# Patient Record
Sex: Male | Born: 1990 | Race: White | Hispanic: No | Marital: Single | Smoking: Never smoker
Health system: Southern US, Community
[De-identification: ages and names within clinical notes are randomized; demographics above are authoritative.]

## PROBLEM LIST (undated history)

## (undated) DIAGNOSIS — F840 Autistic disorder: Secondary | ICD-10-CM

## (undated) DIAGNOSIS — R569 Unspecified convulsions: Secondary | ICD-10-CM

## (undated) DIAGNOSIS — F419 Anxiety disorder, unspecified: Secondary | ICD-10-CM

## (undated) HISTORY — PX: CIRCUMCISION: SUR203

---
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
## 2011-10-20 ENCOUNTER — Emergency Department (HOSPITAL_COMMUNITY): Payer: Medicaid Other

## 2011-10-20 ENCOUNTER — Emergency Department (HOSPITAL_COMMUNITY)
Admission: EM | Admit: 2011-10-20 | Discharge: 2011-10-21 | Disposition: A | Discharge status: Home / Self Care | Payer: Medicaid Other | Attending: Emergency Medicine | Admitting: Emergency Medicine

## 2011-10-20 ENCOUNTER — Emergency Department (HOSPITAL_COMMUNITY)
Admission: EM | Admit: 2011-10-20 | Discharge: 2011-10-20 | Disposition: A | Discharge status: Home / Self Care | Payer: Medicaid Other | Attending: Emergency Medicine | Admitting: Emergency Medicine

## 2011-10-20 ENCOUNTER — Inpatient Hospital Stay (HOSPITAL_COMMUNITY)
Admission: RE | Admit: 2011-10-20 | Discharge: 2011-10-20 | Disposition: A | Discharge status: Home / Self Care | Payer: Medicaid Other | Source: Ambulatory Visit | Attending: Emergency Medicine | Admitting: Emergency Medicine

## 2011-10-20 ENCOUNTER — Encounter (HOSPITAL_COMMUNITY): Payer: Self-pay | Admitting: *Deleted

## 2011-10-20 DIAGNOSIS — F840 Autistic disorder: Secondary | ICD-10-CM | POA: Insufficient documentation

## 2011-10-20 DIAGNOSIS — R569 Unspecified convulsions: Secondary | ICD-10-CM

## 2011-10-20 DIAGNOSIS — F411 Generalized anxiety disorder: Secondary | ICD-10-CM | POA: Insufficient documentation

## 2011-10-20 DIAGNOSIS — Z79899 Other long term (current) drug therapy: Secondary | ICD-10-CM | POA: Insufficient documentation

## 2011-10-20 DIAGNOSIS — R404 Transient alteration of awareness: Secondary | ICD-10-CM | POA: Insufficient documentation

## 2011-10-20 DIAGNOSIS — G40909 Epilepsy, unspecified, not intractable, without status epilepticus: Secondary | ICD-10-CM | POA: Insufficient documentation

## 2011-10-20 HISTORY — DX: Autistic disorder: F84.0

## 2011-10-20 HISTORY — DX: Unspecified convulsions: R56.9

## 2011-10-20 HISTORY — DX: Anxiety disorder, unspecified: F41.9

## 2011-10-20 LAB — POCT I-STAT, CHEM 8
Chloride: 108 mEq/L (ref 96–112)
Creatinine, Ser: 1 mg/dL (ref 0.50–1.35)
Glucose, Bld: 99 mg/dL (ref 70–99)
Potassium: 3.7 mEq/L (ref 3.5–5.1)
Sodium: 142 mEq/L (ref 135–145)

## 2011-10-20 MED ORDER — SODIUM CHLORIDE 0.9 % IV SOLN
1000.0000 mg | INTRAVENOUS | Status: AC
  Administered 2011-10-20: 1000 mg via INTRAVENOUS
  Filled 2011-10-20: qty 10

## 2011-10-20 MED ORDER — LEVETIRACETAM 100 MG/ML PO SOLN: 500.0000 mg | Freq: Two times a day (BID) | ORAL | Status: DC

## 2011-10-20 NOTE — Discharge Instructions (Signed)
Epilepsy  A seizure (convulsion) is a sudden change in brain function that causes a change in behavior, muscle activity, or ability to remain awake and alert. If a person has recurring seizures, this is called epilepsy.  CAUSES   Epilepsy is a disorder with many possible causes. Anything that disturbs the normal pattern of brain cell activity can lead to seizures. Seizure can be caused from illness to brain damage to abnormal brain development. Epilepsy may develop because of:   An abnormality in brain wiring.   An imbalance of nerve signaling chemicals (neurotransmitters).   Some combination of these factors.  Scientists are learning an increasing amount about genetic causes of seizures.  SYMPTOMS   The symptoms of a seizure can vary greatly from one person to another. These may include:   An aura, or warning that tells a person they are about to have a seizure.   Abnormal sensations, such as abnormal smell or seeing flashing lights.   Sudden, general body stiffness.   Rhythmic jerking of the face, arm, or leg - on one or both sides.   Sudden change in consciousness.   The person may appear to be awake but not responding.   They may appear to be asleep but cannot be awakened.   Grimacing, chewing, lip smacking, or drooling.   Often there is a period of sleepiness after a seizure.  DIAGNOSIS   The description you give to your caregiver about what you experienced will help them understand your problems. Equally important is the description by any witnesses to your seizure. A physical exam, including a detailed neurological exam, is necessary. An EEG (electroencephalogram) is a painless test of your brain waves. In this test a diagram is created of your brain waves. These diagrams can be interpreted by a specialist. Pictures of your brain are usually taken with:   An MRI.   A CT scan.  Lab tests may be done to look for:   Signs of infection.   Abnormal blood chemistry.  PREVENTION   There is no way to  prevent the development of epilepsy. If you have seizures that are typically triggered by an event (such as flashing lights), try to avoid the trigger. This can help you avoid a seizure.   PROGNOSIS   Most people with epilepsy lead outwardly normal lives. While epilepsy cannot currently be cured, for some people it does eventually go away. Most seizures do not cause brain damage. It is not uncommon for people with epilepsy, especially children, to develop behavioral and emotional problems. These problems are sometimes the consequence of medicine for seizures or social stress. For some people with epilepsy, the risk of seizures restricts their independence and recreational activities. For example, some states refuse drivers licenses to people with epilepsy.  Most women with epilepsy can become pregnant. They should discuss their epilepsy and the medicine they are taking with their caregivers. Women with epilepsy have a 90 percent or better chance of having a normal, healthy baby.  RISKS AND COMPLICATIONS   People with epilepsy are at increased risk of falls, accidents, and injuries. People with epilepsy are at special risk for two life-threatening conditions. These are status epilepticus and sudden unexplained death (extremely rare). Status epilepticus is a long lasting, continuous seizure that is a medical emergency.  TREATMENT   Once epilepsy is diagnosed, it is important to begin treatment as soon as possible. For about 80 percent of those diagnosed with epilepsy, seizures can be controlled with modern medicines   and surgical techniques. Some antiepileptic drugs can interfere with the effectiveness of oral contraceptives. In 1997, the FDA approved a pacemaker for the brain the (vagus nerve stimulator). This stimulator can be used for people with seizures that are not well-controlled by medicine. Studies have shown that in some cases, children may experience fewer seizures if they maintain a strict diet. The strict  diet is called the ketogenic diet. This diet is rich in fats and low in carbohydrates.  HOME CARE INSTRUCTIONS    Your caregiver will make recommendations about driving and safety in normal activities. Follow these carefully.   Take any medicine prescribed exactly as directed.   Do any blood tests requested to monitor the levels of your medicine.   The people you live and work with should know that you are prone to seizures. They should receive instructions on how to help you. In general, a witness to a seizure should:   Cushion your head and body.   Turn you on your side.   Avoid unnecessarily restraining you.   Not place anything inside your mouth.   Call for local emergency medical help if there is any question about what has occurred.   Keep a seizure diary. Record what you recall about any seizure, especially any possible trigger.   If your caregiver has given you a follow-up appointment, it is very important to keep that appointment. Not keeping the appointment could result in permanent injury and disability. If there is any problem keeping the appointment, you must call back to this facility for assistance.  SEEK MEDICAL CARE IF:    You develop signs of infection or other illness. This might increase the risk of a seizure.   You seem to be having more frequent seizures.   Your seizure pattern is changing.  SEEK IMMEDIATE MEDICAL CARE IF:    A seizure does not stop after a few moments.   A seizure causes any difficulty in breathing.   A seizure results in a very severe headache.   A seizure leaves you with the inability to speak or use a part of your body.  MAKE SURE YOU:    Understand these instructions.   Will watch your condition.   Will get help right away if you are not doing well or get worse.  Document Released: 06/30/2005 Document Revised: 06/19/2011 Document Reviewed: 02/04/2008  [employer] Patient Information 2012 [employer].

## 2011-10-20 NOTE — ED Notes (Signed)
Patient transported to CT with this RN and mother

## 2011-10-20 NOTE — ED Provider Notes (Signed)
History     CSN: [id number]  Arrival date & time 10/20/11  1914   First MD Initiated Contact with Patient 10/20/11 2114      Chief Complaint  Patient presents with  . Seizures    (Consider location/radiation/quality/duration/timing/severity/associated sxs/prior treatment) Patient is a 21 y.o. male presenting with seizures. The history is provided by a parent.  Seizures  This is a recurrent problem. The current episode started 1 to 2 hours ago. The problem has been resolved. There was 1 seizure. The most recent episode lasted 2 to 5 minutes. Pertinent negatives include no confusion, no headaches, no neck stiffness, no chest pain, no cough, no nausea, no vomiting and no diarrhea. Characteristics include eye blinking, rhythmic jerking, loss of consciousness and bit tongue. The episode was witnessed. The seizures did not continue in the ED. The seizure(s) had no focality. Possible causes do not include med or dosage change or recent illness. There has been no fever. There were no medications administered prior to arrival.    Past Medical History  Diagnosis Date  . Autism   . Anxiety   . Seizures     History reviewed. No pertinent past surgical history.  No family history on file.  History  Substance Use Topics  . Smoking status: Never Smoker   . Smokeless tobacco: Not on file  . Alcohol Use: No      Review of Systems  Constitutional: Negative for fever and chills.  HENT: Negative for congestion and rhinorrhea.   Respiratory: Negative for cough and shortness of breath.   Cardiovascular: Negative for chest pain and leg swelling.  Gastrointestinal: Negative for nausea, vomiting, abdominal pain, diarrhea, constipation and blood in stool.  Genitourinary: Negative for dysuria and decreased urine volume.  Neurological: Positive for seizures and loss of consciousness. Negative for numbness and headaches.  Psychiatric/Behavioral: Negative for confusion.  All other systems reviewed  and are negative.    Allergies  Penicillins  Home Medications   Current Outpatient Rx  Name Route Sig Dispense Refill  . CLONAZEPAM 0.5 MG PO TBDP Oral Take 0.5 mg by mouth daily as needed. For travel anxiety      BP 129/76  Pulse 115  Temp(Src) 98.3 F (36.8 C) (Oral)  Resp 16  SpO2 98%  Physical Exam  Nursing note and vitals reviewed. Constitutional: He appears well-developed and well-nourished.  HENT:  Head: Normocephalic and atraumatic.  Right Ear: External ear normal.  Left Ear: External ear normal.  Nose: Nose normal.  Eyes: Pupils are equal, round, and reactive to light.  Neck: Neck supple.  Cardiovascular: Normal rate, regular rhythm, normal heart sounds and intact distal pulses.   Pulmonary/Chest: Effort normal.  Abdominal: Soft. He exhibits no distension and no mass. There is no tenderness. There is no rebound and no guarding.  Musculoskeletal: He exhibits no edema.  Lymphadenopathy:    He has no cervical adenopathy.  Neurological: He is alert. He is disoriented. GCS eye subscore is 4. GCS verbal subscore is 3. GCS motor subscore is 6.  Reflex Scores:      Patellar reflexes are 2+ on the right side and 2+ on the left side.      Achilles reflexes are 2+ on the right side and 2+ on the left side. Skin: Skin is warm and dry.    ED Course  Procedures (including critical care time)  Ct Head Wo Contrast  10/20/2011  *RADIOLOGY REPORT*  Clinical Data: Seizure.  Nonverbal autistic patient.  CT HEAD WITHOUT  CONTRAST  Technique:  Contiguous axial images were obtained from the base of the skull through the vertex without contrast.  Comparison: 09/30/2005  Findings: Bone windows demonstrate hypoplastic frontal sinuses. Clear mastoid air cells.  Soft tissue windows demonstrate no  mass lesion, hemorrhage, hydrocephalus, acute infarct, intra-axial, or extra-axial fluid collection.  IMPRESSION: Normal head CT.  Original Report Authenticated By: Consuello Bossier, M.D.      1. Seizure       MDM  21 yo male with no prior sz hx presents with a 2 min seizure about 1 hour ago. Also had one this morning where he was totally stiff. Had CT head and electrolytes checked, both of which were negative. Had postictal period afterwards, which has now resolved per family. Patient is at his baseline though is a little sleepier than normal. Since he's had 2 seizures today (both new), called neurology. Will load with keppra and place on keppra BID. Will try to follow up with neurologist as already discussed this AM (Dr. Sharene Skeans).         Pricilla Loveless, MD 10/20/11 [phone]

## 2011-10-20 NOTE — Procedures (Signed)
EEG NUMBER:  [id number].  CLINICAL HISTORY:  The patient is a 21 year old male with autism who was found stiff, unresponsive with eyes glazed over.  He had a possible febrile seizure at age 44.  Study is being done to evaluate a single seizure, definitely epilepsy (780.39).  PROCEDURE:  The tracing was carried out on a 32 channel digital Cadwell recorder, reformatted into 16 channel montages with one devoted to EKG. The patient was awake during the recording.  The international 10/20 system lead placement was used.  MEDICATIONS:  Include clonazepam.  RECORDING TIME:  Twenty-four and half minutes.  DESCRIPTION OF FINDINGS:  Dominant frequency is an 8-9 Hz, 10 microvolt alpha range activity that is broadly distributed.  Occasional upper theta and frontally predominant beta range activity was seen.  The patient remained awake throughout the record.  Activating procedures with photic stimulation failed to induce a driving response.  Hyperventilation was not carried out.  EKG showed regular sinus rhythm with ventricular response of 102 beats per minute.  IMPRESSION:  Essentially normal record with the patient awake.     Deanna Artis. Sharene Skeans, M.D.    ZOX:WRUE D:  10/20/2011 19:03:25  T:  10/20/2011 21:48:18  Job #:  [id number]

## 2011-10-20 NOTE — ED Notes (Signed)
Pt returned to room with this RN and mother

## 2011-10-20 NOTE — ED Provider Notes (Signed)
History     CSN: [id number]  Arrival date & time 10/20/11  1308   First MD Initiated Contact with Patient 10/20/11 [phone]      Chief Complaint  Patient presents with  . Seizures    (Consider location/radiation/quality/duration/timing/severity/associated sxs/prior treatment) HPI Patient is a 21 yo M with severe autism who presents today for evaluation of new-onset seizures.  Patient has no prior history of this but has a brother with severe autism.  Patient's mother reports that she heard noises from his room and came into find the patient shaking and unresponsive.  She thinks that this lasted a few minutes.  There was no reported cyanosis or incontinence.  PAtient returned to his baseline within 30 minutes.  He has been sleeping well and has had no recent signs of illness, fevers, or medication changes.  There is no history of trauma. There are no other associated or modifying factors.  Past Medical History  Diagnosis Date  . Autism   . Anxiety     History reviewed. No pertinent past surgical history.  History reviewed. No pertinent family history.  History  Substance Use Topics  . Smoking status: Never Smoker   . Smokeless tobacco: Not on file  . Alcohol Use: No      Review of Systems  Unable to perform ROS: Other    Allergies  Penicillins  Home Medications   Current Outpatient Rx  Name Route Sig Dispense Refill  . CLONAZEPAM 0.5 MG PO TABS Oral Take 0.5 mg by mouth See admin instructions. Patient only uses for anxiety when travelling      BP 133/82  Pulse 127  Temp(Src) 98.6 F (37 C) (Oral)  Resp 18  SpO2 99%  Physical Exam  Nursing note and vitals reviewed. Constitutional: He appears well-developed and well-nourished. No distress.  HENT:  Head: Normocephalic and atraumatic.  Eyes: Conjunctivae and EOM are normal. Pupils are equal, round, and reactive to light.  Neck: Normal range of motion.  Cardiovascular: Normal rate, regular rhythm, normal heart  sounds and intact distal pulses.   Pulmonary/Chest: Effort normal and breath sounds normal. No respiratory distress.  Abdominal: Soft. Bowel sounds are normal. He exhibits no distension. There is no tenderness. There is no rebound and no guarding.  Musculoskeletal: Normal range of motion. He exhibits no edema and no tenderness.  Neurological: He is alert.       Patient has severe autism and though he can say words he is minimally interactive.  He does not consistently follow commands which is baseline.  He is able to move all 4 extremities and has no obvious cranial nerve deficits.  Patient is able to ambulate without difficulty and mom confirms he is at his baseline.  Skin: Skin is warm and dry. No rash noted.    ED Course  Procedures (including critical care time)   Labs Reviewed  POCT I-STAT, CHEM 8   Ct Head Wo Contrast  10/20/2011  *RADIOLOGY REPORT*  Clinical Data: Seizure.  Nonverbal autistic patient.  CT HEAD WITHOUT CONTRAST  Technique:  Contiguous axial images were obtained from the base of the skull through the vertex without contrast.  Comparison: 09/30/2005  Findings: Bone windows demonstrate hypoplastic frontal sinuses. Clear mastoid air cells.  Soft tissue windows demonstrate no  mass lesion, hemorrhage, hydrocephalus, acute infarct, intra-axial, or extra-axial fluid collection.  IMPRESSION: Normal head CT.  Original Report Authenticated By: Consuello Bossier, M.D.     1. Seizure  MDM  Patient was evaluated by myself for new onset seizure.  Vials, chemistry , and CT head were all unremarkable.  I discussed the patient with Dr. Sharene Skeans given his autism.  Dr. Sharene Skeans requested ECG while in the ED as patient was being cooperative today.  This was arranged and performed prior to ED discharge.  Patient remained asymptomatic and at his neurologic baseline while in the ED.  Dr. Sharene Skeans did not recommend any meds at this time and patient will follow-up in  clinic.        Cyndra Numbers, MD 10/21/11 2052

## 2011-10-20 NOTE — ED Notes (Addendum)
No changes.  OK for pt to drink PO fluids (per EDP), pt/mother requesting water for pt.
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
## 2011-10-20 NOTE — ED Notes (Signed)
Mother reports pt had seizure this am, pt with no hx of seizures, pt is autistic. Reports pt has very stiff and "out of it" unable to state how long seizure lasted. Was not incontinent of urine.

## 2011-10-21 NOTE — ED Provider Notes (Signed)
I  reviewed the resident's note and I agree with the findings and plan.  Cheri Guppy, MD 10/21/11 [phone]

## 2013-02-18 IMAGING — CT CT HEAD W/O CM
1 of 2 series · 13 of 30 positions shown, 17 images · non-contrast
Comparison: 09/30/2005

CLINICAL DATA: Seizure.  Nonverbal autistic patient.

CT HEAD WITHOUT CONTRAST
TECHNIQUE: Contiguous axial images were obtained from the base of
the skull through the vertex without contrast.

[Series 2: brain · axial · 0.45mm/px · z∈[+130,+251]mm · 13 of 28 slices shown, 17 images]
[im 2/28  brain]
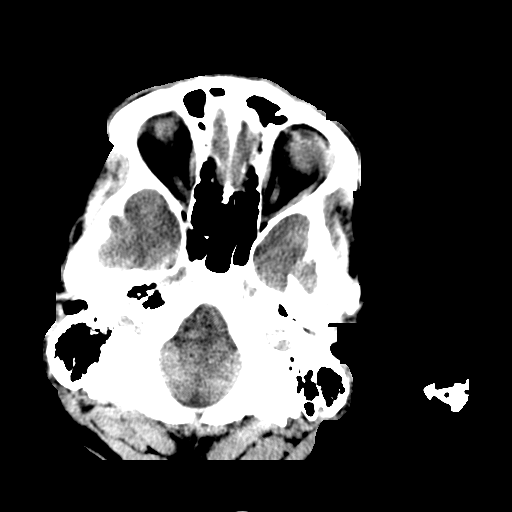
[im 2/28  bone]
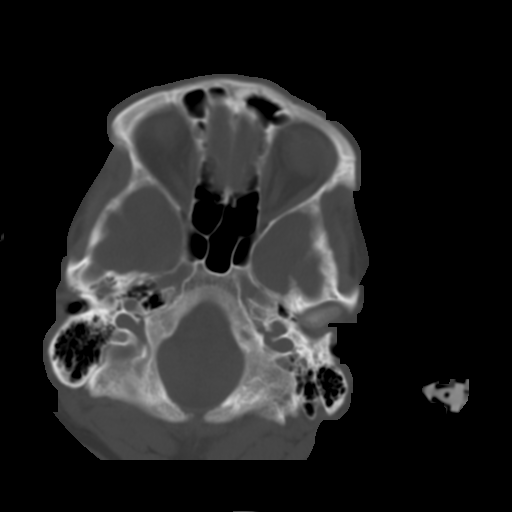
[im 4/28  brain]
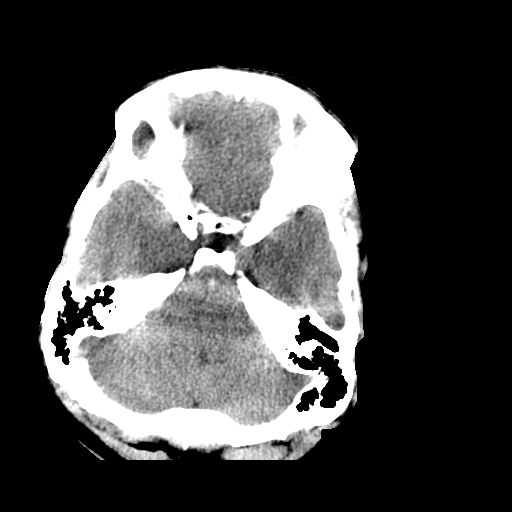
[im 6/28  brain]
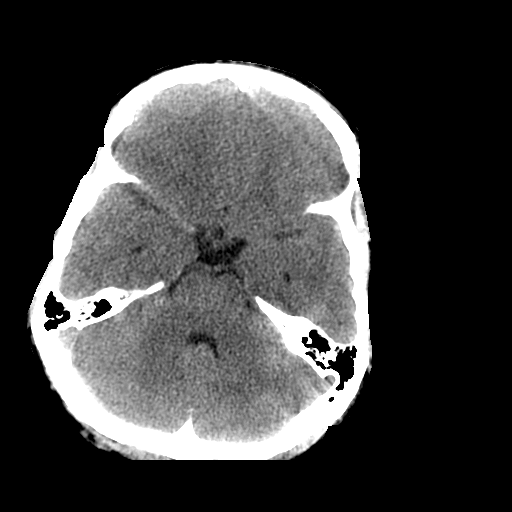
[im 8/28  brain]
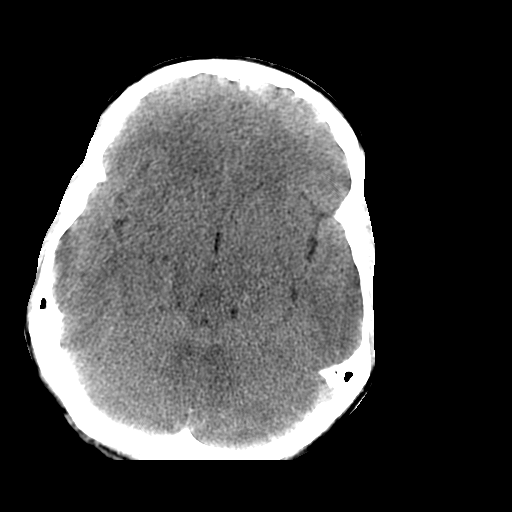
[im 10/28  brain]
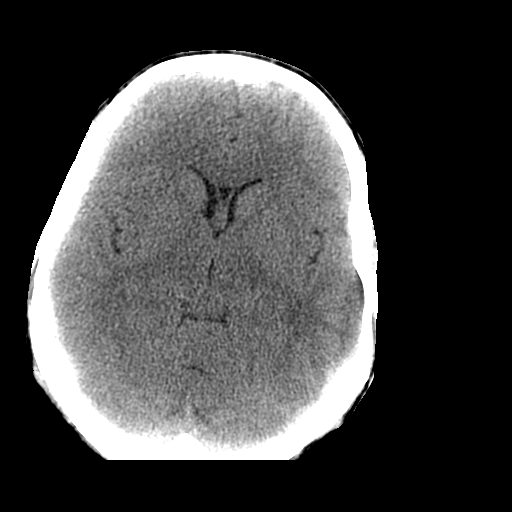
[im 10/28  bone]
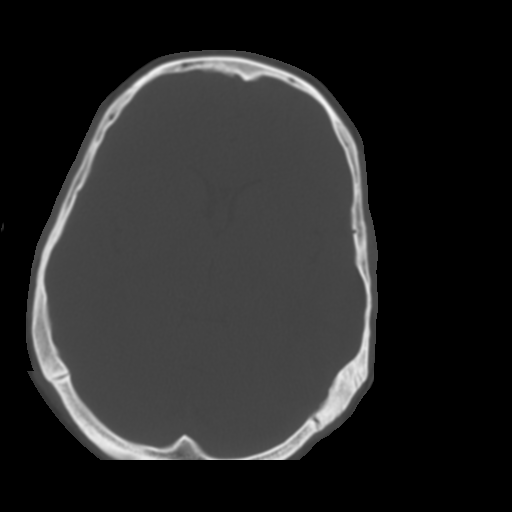
[im 12/28  brain]
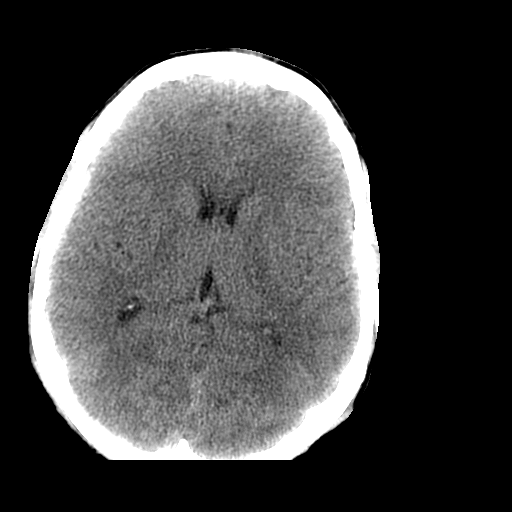
[im 14/28  brain]
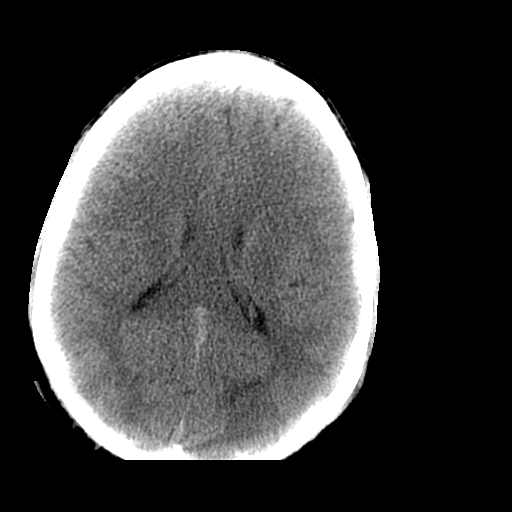
[im 16/28  brain]
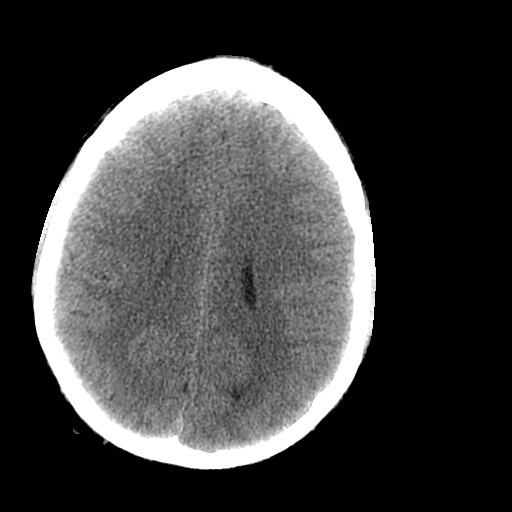
[im 18/28  brain]
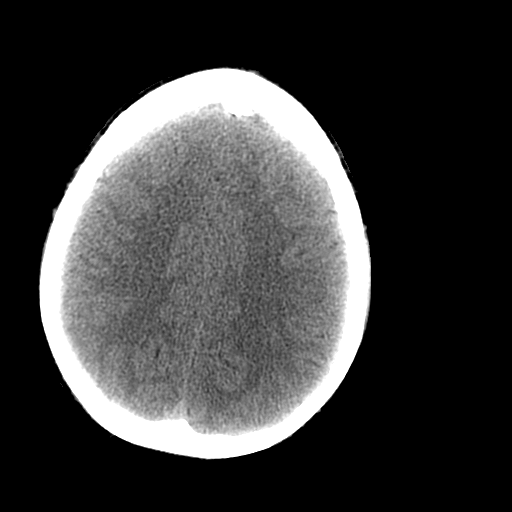
[im 18/28  bone]
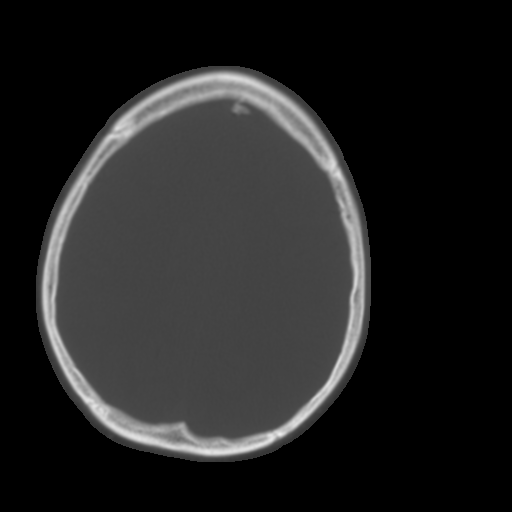
[im 20/28  brain]
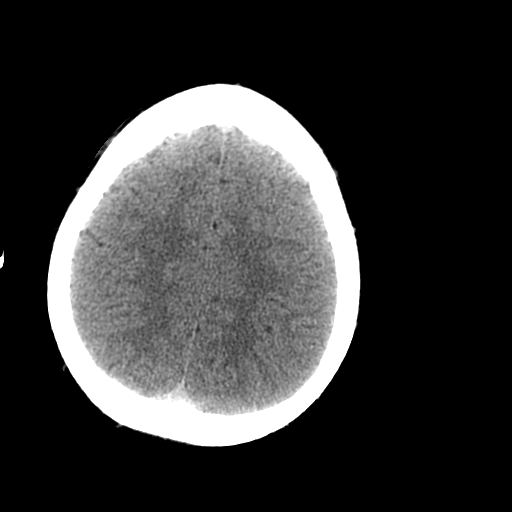
[im 22/28  brain]
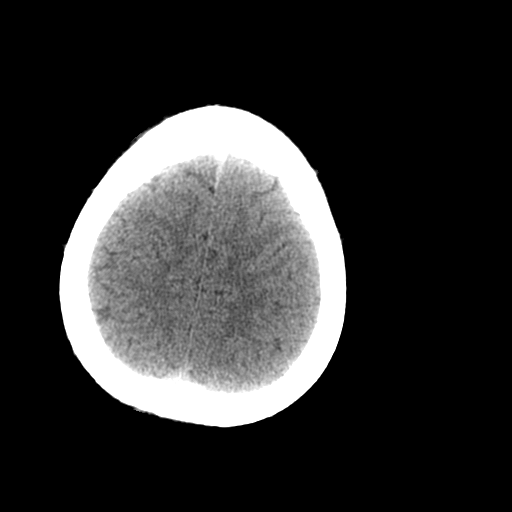
[im 24/28  brain]
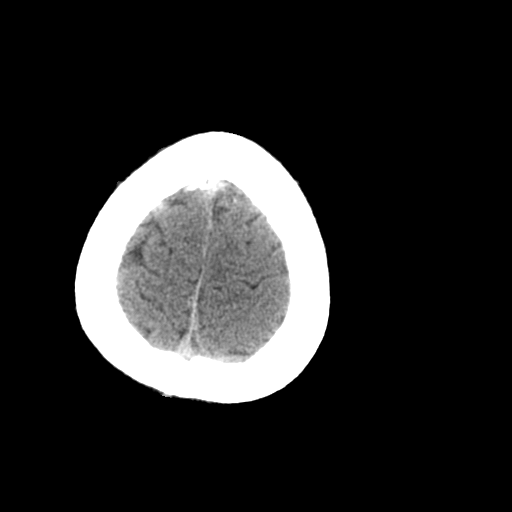
[im 26/28  brain]
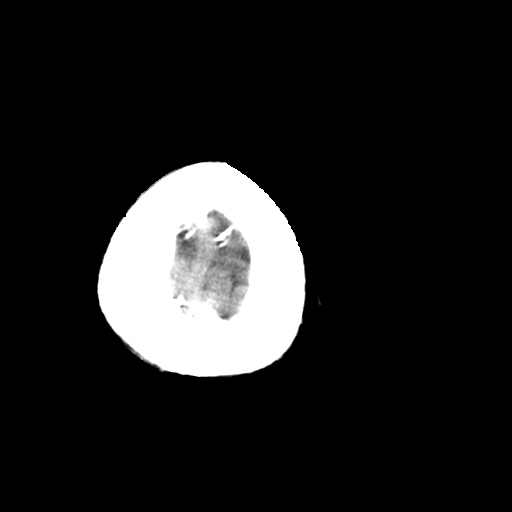
[im 26/28  bone]
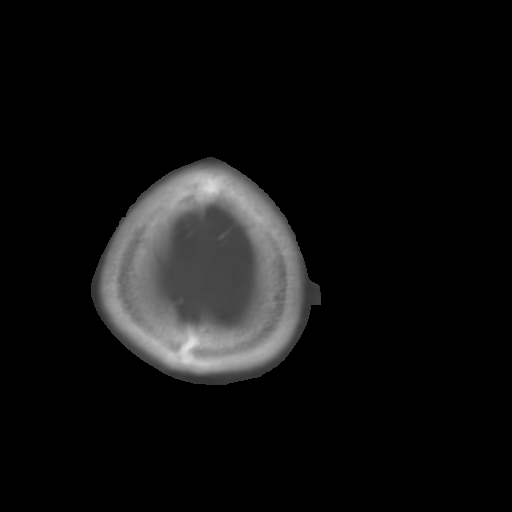

[13 of 30 positions shown; findings below may reference images not displayed]

FINDINGS: Bone windows demonstrate hypoplastic frontal sinuses.
Clear mastoid air cells.

Soft tissue windows demonstrate no  mass lesion, hemorrhage,
hydrocephalus, acute infarct, intra-axial, or extra-axial fluid
collection.
IMPRESSION: Normal head CT.

## 2013-02-24 DIAGNOSIS — F840 Autistic disorder: Secondary | ICD-10-CM

## 2013-02-24 DIAGNOSIS — G40309 Generalized idiopathic epilepsy and epileptic syndromes, not intractable, without status epilepticus: Secondary | ICD-10-CM | POA: Insufficient documentation

## 2013-03-16 ENCOUNTER — Encounter: Payer: Self-pay | Admitting: Pediatrics

## 2013-03-16 ENCOUNTER — Ambulatory Visit (INDEPENDENT_AMBULATORY_CARE_PROVIDER_SITE_OTHER): Payer: Medicaid Other | Admitting: Pediatrics

## 2013-03-16 VITALS — BP 116/80 | HR 76 | Ht 67.5 in | Wt 150.2 lb

## 2013-03-16 DIAGNOSIS — F840 Autistic disorder: Secondary | ICD-10-CM

## 2013-03-16 DIAGNOSIS — G40309 Generalized idiopathic epilepsy and epileptic syndromes, not intractable, without status epilepticus: Secondary | ICD-10-CM

## 2013-03-16 MED ORDER — LEVETIRACETAM 100 MG/ML PO SOLN: 500.0000 mg | Freq: Two times a day (BID) | ORAL | Status: DC

## 2013-03-16 NOTE — Progress Notes (Signed)
Patient: Timothy Hogan MRN: [id number] Sex: male DOB: 03/06/1991  Provider: Deetta Perla, MD Location of Care: [facility]  Note type: Routine return visit  History of Present Illness: Referral Source: Dr. Maudie Flakes History from: mother and [name] chart Chief Complaint: Seizures/Autistic Disorder  Timothy Hogan is a 22 y.o. male who returns for evaluation and management of utism, and generalized tonic-clonic seizures.  The patient was seen March 16, 2013, for the first time since August 20, 2012.  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  He goes by his middle name "Timothy Hogan."  Since his last visit, the seizures have been in control.  Indeed he has not had any seizures since low dose of levetiracetam was started.  Fortunately, he has tolerated the medicine well.  His general physical health has been good.  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  His mother is concerned because they have taken away speech and language services despite the fact that he has began to speak in brief phrases.  They also dropped his adaptive PE.  There are seven students in his class, one teacher and two aids.  The patient's brother has seizures and autism.  There were no predisposing factors to his underlying encephalopathy.  He takes clonazepam for agitation and this has worked quite well for him and interestingly he has not developed significant tolerance to the medication.  Review of Systems: 12 system review was remarkable for snoring  Past Medical History  Diagnosis Date  . Autism   . Anxiety   . Seizures    Hospitalizations: no, Head Injury: no, Nervous System Infections: no, Immunizations up to date: yes Past Medical History Comments: The patient was diagnosed with autism at 22 years of age.  Language began progress 18 months.  Birth History 4 lbs. 2 oz. infant born at [redacted] weeks  gestational age to a 21 year old gravida 2 para 36 male Gestation complicated by maternal one half pack per day smoking, dilated cervix in early labor requiring terbutaline Labor was induced Normal spontaneous vaginal delivery The child was hospitalized for 2-1/2 weeks and had one day of phototherapy. He appeared to be normal up to a year of age.  He walked at 15 months but regressed in his language thereafter.  Behavior History none  Surgical History History reviewed. No pertinent past surgical history. Surgeries: no Surgical History Comments: None  Family History family history includes Autism in his brother; Diabetes in his father; Seizures in his brother. Family History is negative migraines, seizures, cognitive impairment, blindness, deafness, birth defects, chromosomal disorder, autism.  Social History History   Social History  . Marital Status: Single    Spouse Name: N/A    Number of Children: N/A  . Years of Education: N/A   Social History Main Topics  . Smoking status: Never Smoker   . Smokeless tobacco: Never Used  . Alcohol Use: No  . Drug Use: No  . Sexual Activity: No   Other Topics Concern  . None   Social History Narrative  . None   Educational level 12th grade School Attending: [employer]. Occupation: Consulting civil engineer  Living with mother  Hobbies/Interest: none School comments Timothy Hogan is doing fine in school.  Current Outpatient Prescriptions on File Prior to Visit  Medication Sig Dispense Refill  . clonazePAM (KLONOPIN) 0.5 MG disintegrating tablet Take 0.5 mg by mouth daily as  needed. For travel anxiety      . levETIRAcetam (KEPPRA) 100 MG/ML solution Take 5 mLs (500 mg total) by mouth 2 (two) times daily.  500 mL  0   No current facility-administered medications on file prior to visit.   The medication list was reviewed and reconciled. All changes or newly prescribed medications were explained.  A complete  medication list was provided to the patient/caregiver.  Allergies  Allergen Reactions  . Penicillins Rash    Physical Exam BP 116/80  Pulse 76  Ht 5' 7.5" (1.715 m)  Wt 150 lb 3.2 oz (68.13 kg)  BMI 23.16 kg/m2  General: alert, well developed, well nourished, in no acute distress right-handed, sandy hair, brown eyes, scruffy beard; He is wearing a headphone that diminishes environmental sound. Head: normocephalic, no dysmorphic features Ears, Nose and Throat: Otoscopic: tympanic membranes nonvisualized,wax occludes both .  Pharynx: oropharynx is pink without exudates or tonsillar hypertrophy. Neck: supple, full range of motion, no cranial or cervical bruits Respiratory: auscultation clear Cardiovascular: no murmurs, pulses are normal Musculoskeletal: no skeletal deformities or apparent scoliosis Skin: no rashes or neurocutaneous lesions  Neurologic Exam  Mental Status: The patient sat quietly during history taking and was cooperative for examination. His behavior is consistent with his autism.  He has limited language,  short attention span, and poor eye contact.  He follows some commands but I did not hear any words. Cranial Nerves: He is able to localize objects in his visual field and sounds out of his vision.  He has symmetric facial strength midline tongue.  He had positive red reflex and full extraocular movements. Motor: Normal  functional strength, tone, and mass; good fine motor movements; no pronator drift. Sensory: intact responses to noxious stimuli and stereognosis  Coordination: good finger-to-nose, rapid repetitive alternating movements and finger apposition , no dysmetria  Gait and Station: slightly broad-based gait and station; patient is able to walk on heels, toes and tandem with difficulty; balance is adequate; Romberg exam is negative; Gower response is negative Reflexes: symmetric and diminished bilaterally; no clonus; bilateral flexor plantar  responses.  Assessment 1. 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 2. Autistic spectrum disorder 399.00.  Discussion Things seemed to be very stable at this time.  There is no reason to change his levetiracetam.  I agree with his mother that he should have speech therapy reinstated and will be happy to go on record as I am encouraging that.  His weight has been quite stable since he was last seen.  Plan Continue levetiracetam at his current dose.  I will prescribe the medication.  I have not been prescribing the clonazepam.  He has not needed Diastat and so I did not replace it.  I spent 25 minutes of face-to-face time with the patient and his mother more than half of it in consultation.  I will see him in six months' time sooner depending upon clinical need.  I have asked the family to contact me if he has further seizures or any problems with his medication.  Meds ordered this encounter  Medications  . diazepam (DIASTAT ACUDIAL) 20 MG GEL    Sig: Place 20 mg rectally as needed (Give 15 mg rectally 2 miutes after onset of seizure).   Deetta Perla MD

## 2013-03-17 ENCOUNTER — Encounter: Payer: Self-pay | Admitting: Pediatrics

## 2013-10-12 ENCOUNTER — Other Ambulatory Visit: Payer: Self-pay

## 2013-10-12 DIAGNOSIS — G40309 Generalized idiopathic epilepsy and epileptic syndromes, not intractable, without status epilepticus: Secondary | ICD-10-CM

## 2013-10-12 MED ORDER — LEVETIRACETAM 100 MG/ML PO SOLN: 500.0000 mg | Freq: Two times a day (BID) | ORAL | Status: DC

## 2013-11-18 ENCOUNTER — Other Ambulatory Visit: Payer: Self-pay | Admitting: Family

## 2013-11-18 DIAGNOSIS — G40309 Generalized idiopathic epilepsy and epileptic syndromes, not intractable, without status epilepticus: Secondary | ICD-10-CM

## 2013-11-18 MED ORDER — LEVETIRACETAM 100 MG/ML PO SOLN: 500.0000 mg | Freq: Two times a day (BID) | ORAL | Status: DC

## 2013-11-21 ENCOUNTER — Telehealth: Payer: Self-pay

## 2013-11-21 DIAGNOSIS — G40309 Generalized idiopathic epilepsy and epileptic syndromes, not intractable, without status epilepticus: Secondary | ICD-10-CM

## 2013-11-21 MED ORDER — LEVETIRACETAM 100 MG/ML PO SOLN: 500.0000 mg | Freq: Two times a day (BID) | ORAL | Status: DC

## 2013-11-21 NOTE — Telephone Encounter (Signed)
Pharmacy faxed request for Levetiracetam 100mg /mL Solution. I called pharmacy and spoke w [name]. They rcvd the refill authorization that was sent on 11/18/13 for 300 mLs, enough for one month. Pt already picked it up from pharmacy. Pt did not have an appt for f/u when the Rx was sent. He has since called for appt as directed on the Rx. He has appt w Dr.H on 02/13/14. Can we send Rx so that he will have enough to get him through until he is seen?

## 2013-11-21 NOTE — Telephone Encounter (Signed)
Yes, I sent in refills to last until then. Inetta Fermoina

## 2014-02-13 ENCOUNTER — Ambulatory Visit (INDEPENDENT_AMBULATORY_CARE_PROVIDER_SITE_OTHER): Payer: Medicaid Other | Admitting: Pediatrics

## 2014-02-13 ENCOUNTER — Encounter: Payer: Self-pay | Admitting: Pediatrics

## 2014-02-13 VITALS — BP 116/70 | HR 72 | Ht 68.0 in | Wt 158.6 lb

## 2014-02-13 DIAGNOSIS — F840 Autistic disorder: Secondary | ICD-10-CM

## 2014-02-13 DIAGNOSIS — G40309 Generalized idiopathic epilepsy and epileptic syndromes, not intractable, without status epilepticus: Secondary | ICD-10-CM

## 2014-02-13 MED ORDER — LEVETIRACETAM 100 MG/ML PO SOLN: 500.0000 mg | Freq: Two times a day (BID) | ORAL | Status: DC

## 2014-02-13 NOTE — Progress Notes (Signed)
Patient: Timothy Hogan MRN: [id number] Sex: male DOB: Apr 05, 1991  Provider: Deetta Perla, MD Location of Care: [facility]  Note type: Routine return visit  History of Present Illness: Referral Source: Dr. Maudie Hogan History from: mother and [facility] chart Chief Complaint: Seizures/Autistic Disorder   Timothy Hogan is a 23 y.o. male who returns for evaluation and management of seizures and autism spectrum disorder.  Timothy Hogan returns on February 13, 2014, for the first time since March 16, 2013.  He has undifferentiated autism with very limited language and well controlled generalized tonic-clonic seizures.  Levetiracetam in low dose has controlled his seizures without significant side effects.  He has a brother with seizures and autism.  The boys have CAPS workers to supervise them.  Overall Timothy Hogan has good health.  He enjoys watching TV, playing on the computer, and can boot the programs that interest him.  He likes music and also enjoys swimming.  He is in his last year at [employer].  This is his school for children with autism.  When he completes school this year at 15, he will leave the public schools.  His mother is trying to find an appropriate setting for him to be.  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  Usually this goes fairly well.  On occasion, however, he becomes over stimulated.   When he does, he becomes loud.  During the summer, he has gone to bed at 12:30 and sleeps until 10 to 11:30.  During the school year, he goes to bed at 11:30 and sleeps until 6:30.  He rarely comes home and needs to go to sleep.  His psychiatric provider is Timothy Hogan.  He is followed by [facility] on [address].  He also is followed by Dr. Billey Hogan, his dentist.  Timothy Hogan's only medication is levetiracetam and he also takes low-dose clonazepam in the morning and in the afternoon when he is on the bus.   This seems to calm him.  Sometimes, it makes him sleepy.  Review of Systems: 12 system review was unremarkable  Past Medical History  Diagnosis Date  . Autism   . Anxiety   . Seizures    Hospitalizations: No., Head Injury: No., Nervous System Infections: No., Immunizations up to date: Yes.   Past Medical History diagnosed with autism at 23 years of age. Language began to regress at 18 months.   Birth History 4 lbs. 2 oz. infant born at [redacted] weeks gestational age to a 22 year old gravida 2 para 1 male  Gestation complicated by maternal one half pack per day smoking, dilated cervix in early labor requiring terbutaline  Labor was induced  Normal spontaneous vaginal delivery  The child was hospitalized for 2-1/2 weeks and had one day of phototherapy.  He appeared to be normal up to a year of age. He walked at 15 months but regressed in his language thereafter.   Behavior History easily agitated particularly in unfamiliar surroundings  Surgical History History reviewed. No pertinent past surgical history.  Family History family history includes Autism in his brother; Diabetes in his father; Seizures in his brother. Family history is negative for migraines, blindness, deafness, birth defects, or chromosomal disorder.  Social History History   Social History  . Marital Status: Single    Spouse Name: N/A    Number of Children: N/A  . Years of Education: N/A   Social History Main  Topics  . Smoking status: Passive Smoke Exposure - Never Smoker  . Smokeless tobacco: Never Used  . Alcohol Use: No  . Drug Use: No  . Sexual Activity: No   Other Topics Concern  . None   Social History Narrative  . None   Educational level special education School Attending: [employer]  high [employer]. Occupation: Consulting civil engineertudent  Living with mother  Hobbies/Interest: Enjoys watching TV, being on his computer, swimming and listening to music. School comments Timothy Hogan did very well this  past school year, he is out for summer break.   Current Outpatient Prescriptions on File Prior to Visit  Medication Sig Dispense Refill  . clonazePAM (KLONOPIN) 0.5 MG disintegrating tablet Take 0.5 mg by mouth daily as needed. For travel anxiety      . diazepam (DIASTAT ACUDIAL) 20 MG GEL Place 20 mg rectally as needed (Give 15 mg rectally 2 miutes after onset of seizure).       No current facility-administered medications on file prior to visit.   The medication list was reviewed and reconciled. All changes or newly prescribed medications were explained.  A complete medication list was provided to the patient/caregiver.  Allergies  Allergen Reactions  . Penicillins Rash    Physical Exam BP 116/70  Pulse 72  Ht 5\' 8"  (1.727 m)  Wt 158 lb 9.6 oz (71.94 kg)  BMI 24.12 kg/m2  General: alert, well developed, well nourished, in no acute distress right-handed, sandy hair, brown eyes, scruffy beard; He is wearing a headphone that diminishes environmental sound.  Head: normocephalic, no dysmorphic features  Ears, Nose and Throat: Otoscopic: tympanic membranes nonvisualized,wax occludes both . Pharynx: oropharynx is pink without exudates or tonsillar hypertrophy.  Neck: supple, full range of motion, no cranial or cervical bruits  Respiratory: auscultation clear  Cardiovascular: no murmurs, pulses are normal  Musculoskeletal: no skeletal deformities or apparent scoliosis  Skin: no rashes or neurocutaneous lesions   Neurologic Exam   Mental Status: The patient sat quietly during history taking and was cooperative for examination. His behavior is consistent with his autism. He has limited language, short attention span, and poor eye contact. He follows some commands but I did not hear any words.  Cranial Nerves: He is able to localize objects in his visual field and sounds out of his vision. He has symmetric facial strength midline tongue. He had positive red reflex and full extraocular  movements.  Motor: Normal functional strength, tone, and mass; good fine motor movements; no pronator drift.  Sensory: intact responses to noxious stimuli and stereognosis  Coordination: good finger-to-nose, rapid repetitive alternating movements and finger apposition , no dysmetria  Gait and Station: slightly broad-based gait and station; patient is able to walk on heels, toes and tandem with difficulty; balance is adequate; Romberg exam is negative; Gower response is negative  Reflexes: symmetric and diminished bilaterally; no clonus; bilateral flexor plantar responses.  Assessment 1. Generalized convulsive epilepsy without mention of intractable epilepsy, 345.10. 2. Autism spectrum disorder with accompanying language impairment and intellectual disability requiring very substantial support (level 3).  Plan Levetiracetam will be continued.  There is no reason to change it.  Clonazepam will also be reordered.  He will return to see me in six months' time or sooner depending upon clinical need.  I spent 30 minutes of face-to-face time with Timothy PaceBryant more than half of it in consultation.  Deetta PerlaWilliam H Malia Corsi MD

## 2014-02-14 DIAGNOSIS — F840 Autistic disorder: Secondary | ICD-10-CM | POA: Insufficient documentation

## 2014-09-18 ENCOUNTER — Other Ambulatory Visit: Payer: Self-pay

## 2014-09-18 DIAGNOSIS — G40309 Generalized idiopathic epilepsy and epileptic syndromes, not intractable, without status epilepticus: Secondary | ICD-10-CM

## 2014-09-18 MED ORDER — LEVETIRACETAM 100 MG/ML PO SOLN: 500.0000 mg | Freq: Two times a day (BID) | ORAL | Status: DC

## 2014-10-18 ENCOUNTER — Encounter: Payer: Self-pay | Admitting: Pediatrics

## 2014-10-18 ENCOUNTER — Ambulatory Visit (INDEPENDENT_AMBULATORY_CARE_PROVIDER_SITE_OTHER): Payer: Medicaid Other | Admitting: Pediatrics

## 2014-10-18 VITALS — BP 100/72 | HR 108 | Ht 68.0 in | Wt 154.4 lb

## 2014-10-18 DIAGNOSIS — G40309 Generalized idiopathic epilepsy and epileptic syndromes, not intractable, without status epilepticus: Secondary | ICD-10-CM | POA: Diagnosis not present

## 2014-10-18 DIAGNOSIS — F411 Generalized anxiety disorder: Secondary | ICD-10-CM | POA: Diagnosis not present

## 2014-10-18 DIAGNOSIS — F840 Autistic disorder: Secondary | ICD-10-CM | POA: Diagnosis not present

## 2014-10-18 MED ORDER — CLONAZEPAM 0.5 MG PO TBDP: 0.5000 mg | ORAL_TABLET | Freq: Every day | ORAL | Status: DC | PRN

## 2014-10-18 MED ORDER — LEVETIRACETAM 100 MG/ML PO SOLN: 500.0000 mg | Freq: Two times a day (BID) | ORAL | Status: DC

## 2014-10-18 NOTE — Progress Notes (Signed)
Patient: Timothy Hogan MRN: [id number] Sex: male DOB: September 21, 1990  Provider: Deetta Perla, MD Location of Care: [facility]  Note type: Routine return visit  History of Present Illness: Referral Source: Dr. Maudie Flakes History from: mother, patient and [facility] chart Chief Complaint: Seizures  Timothy Hogan is a 24 y.o. male who returns for evaluation in October 18, 2014, for the first time since February 13, 2014.  The patient has autism spectrum disorder with intellectual disability and limited language.  He has well controlled generalized tonic-clonic seizures.  Levetiracetam in low dose has controlled seizures without significant side effects.  He has a brother with seizures and autism.  He will graduate from [employer] this year.  This is problematic.  His mother told me that he has not had a Merchandiser, retail.  My notes must be an Error as regards that.  Because he is not had CAPS workers, she is having a hard time finding programs for him to enter after he graduates.  He apparently has a great deal of anxiety about public areas.  He also has some anxiety when he travels.  Apparently if the road changes either from dark to light or there is a manhole cover or a crack in the pavement, he becomes quite upset.  His mother has to sedate him when she takes him on trips in any duration.  I suggested to her that she contact members of the [employer] of [facility] to determine whether there are any opportunities for her son both in terms of daycare, and opportunities for work and socialization.  The patient's health has been good.  His weight has dropped a bit since we saw him last.  He has normal patterns of sleep and appetite.  He receives clonazepam as needed for anxiety.  His mother has not had to use diazepam gel because there have been no seizures.  Review of Systems: 12 system review was remarkable for seizures  Past Medical  History Diagnosis Date  . Autism   . Anxiety   . Seizures    Hospitalizations: No., Head Injury: No., Nervous System Infections: No., Immunizations up to date: Yes.    Diagnosed with autism at 24 years of age. Language began to regress at 18 months.   Birth History 4 lbs. 2 oz. infant born at [redacted] weeks gestational age to a 23 year old gravida 2 para 31 male  Gestation complicated by maternal one half pack per day smoking, dilated cervix in early labor requiring terbutaline  Labor was induced  Normal spontaneous vaginal delivery  The child was hospitalized for 2-1/2 weeks and had one day of phototherapy.  He appeared to be normal up to a year of age. He walked at 15 months but regressed in his language thereafter.  Behavior History easily agitated particularly in unfamiliar surroundings  Surgical History Procedure Laterality Date  . Circumcision  1993   Family History family history includes Autism in his brother; Diabetes in his father; Seizures in his brother. Family history is negative for migraines, blindness, deafness, birth defects, or chromosomal disorder.  Social History . Marital Status: Single    Spouse Name: N/A  . Number of Children: N/A  . Years of Education: N/A   Social History Main Topics  . Smoking status: Passive Smoke Exposure - Never Smoker  . Smokeless tobacco: Never Used     Comment: Mother smokes   . Alcohol Use: No  . Drug Use: No  .  Sexual Activity: No   Social History Narrative   Educational level 12th grade special education  School Attending: [employer].  Occupation: Consulting civil engineertudent  Living with mother   Hobbies/Interest: Enjoys watching TV, listening to music and being on his computer.   School comments [name] is doing great in school.   Allergies Allergen Reactions  . Penicillins Rash   Physical Exam BP 100/72 mmHg  Pulse 108  Ht 5\' 8"  (1.727 m)  Wt 154 lb 6.4 oz (70.035 kg)  BMI 23.48  kg/m2  General: alert, well developed, well nourished, in no acute distress right-handed, sandy hair, brown eyes, scruffy beard; He is wearing a headphone that diminishes environmental sound.  Head: normocephalic, no dysmorphic features  Ears, Nose and Throat: Otoscopic: tympanic membranes nonvisualized,wax occludes both . Pharynx: oropharynx is pink without exudates or tonsillar hypertrophy.  Neck: supple, full range of motion, no cranial or cervical bruits  Respiratory: auscultation clear  Cardiovascular: no murmurs, pulses are normal  Musculoskeletal: no skeletal deformities or apparent scoliosis  Skin: no rashes or neurocutaneous lesions   Neurologic Exam  Mental Status: The patient sat quietly during history taking and was cooperative for examination. His behavior is consistent with his autism. He has limited language, short attention span, and poor eye contact. He follows some commands but I heard very little spoken language Cranial Nerves: He is able to localize objects in his visual field and sounds out of his vision. He has symmetric facial strength midline tongue. He had positive red reflex and full extraocular movements.  Motor: Normal functional strength, tone, and mass; good fine motor movements; no pronator drift.  Sensory: intact responses to noxious stimuli and stereognosis  Coordination: good finger-to-nose, rapid repetitive alternating movements and finger apposition , no dysmetria  Gait and Station: slightly broad-based gait and station; patient is able to walk on heels, toes and tandem with difficulty; balance is adequate; Romberg exam is negative; Gower response is negative  Reflexes: symmetric and diminished bilaterally; no clonus; bilateral flexor plantar responses.  Assessment 1. Generalized convulsive epilepsy without mention of intractable epilepsy, G40.309. 2. Autism spectrum disorder with accompanying language impairment and intellectual disability  requiring substantial support (level 3), F84.0. 3. Anxiety state, F41.1.  Plan Continue levetiracetam and clonazepam in their current doses.  I will see him in six months' time sooner depending upon clinical need.  I spent 30 minutes of face-to-face time with the patient and his mother, more than half of it in consultation.   Medication List   This list is accurate as of: 10/18/14 11:59 PM.       clonazePAM 0.5 MG disintegrating tablet  Commonly known as:  KLONOPIN  Take 1 tablet (0.5 mg total) by mouth daily as needed. For travel anxiety     DIASTAT ACUDIAL 20 MG Gel  Generic drug:  diazepam  Place 20 mg rectally as needed (Give 15 mg rectally 2 miutes after onset of seizure).     levETIRAcetam 100 MG/ML solution  Commonly known as:  KEPPRA  Take 5 mLs (500 mg total) by mouth 2 (two) times daily.      The medication list was reviewed and reconciled. All changes or newly prescribed medications were explained.  A complete medication list was provided to the patient/caregiver.  Deetta PerlaWilliam H Jenavee Laguardia MD

## 2015-05-15 ENCOUNTER — Ambulatory Visit (INDEPENDENT_AMBULATORY_CARE_PROVIDER_SITE_OTHER): Payer: Medicaid Other | Admitting: Pediatrics

## 2015-05-15 ENCOUNTER — Encounter: Payer: Self-pay | Admitting: Pediatrics

## 2015-05-15 VITALS — BP 100/68 | HR 64 | Ht 67.5 in | Wt 154.8 lb

## 2015-05-15 DIAGNOSIS — G40309 Generalized idiopathic epilepsy and epileptic syndromes, not intractable, without status epilepticus: Secondary | ICD-10-CM

## 2015-05-15 DIAGNOSIS — F840 Autistic disorder: Secondary | ICD-10-CM | POA: Diagnosis not present

## 2015-05-15 MED ORDER — DIASTAT ACUDIAL 20 MG RE GEL: RECTAL | Status: DC

## 2015-05-15 MED ORDER — LEVETIRACETAM 100 MG/ML PO SOLN: 500.0000 mg | Freq: Two times a day (BID) | ORAL | Status: DC

## 2015-05-15 NOTE — Progress Notes (Signed)
Patient: Timothy Hogan MRN: [id number] Sex: male DOB: 10/13/1990  Provider: Deetta PerlaHICKLING,Rutha Melgoza H, MD [facility]  Note type: Routine return visit  History of Present Illness: Referral Source: Theadore NanHilary McCormick, MD History from: mother, [facility] and [facility] Chief Complaint: Seizures  Timothy Hogan is a 24 y.o. male who was evaluated May 15, 2015, for the first time since October 18, 2014.  He has autism spectrum disorder with intellectual disability and limited language.  He has well controlled generalized tonic-clonic seizures.  He was here today with his mother.  He is graduated from [employer] this past year.  His mother has found number of activities during the day to stimulate him.  She, her mother, her father, and other family members have taken him out of the home shopping and performing other errands.  He has not experienced any seizures in years.  Though he has occasional arousals, he generally sleeps well and his appetite is good.  There have been no significant health issues.  His weight is stable.  He sees Database administratorLinda Greninger for treatment with clonazepam.  She takes and tolerates levetiracetam.  There has been no need to give him rectal diazepam.  Review of Systems: 12 system review was unremarkable  Past Medical History Diagnosis Date  . Autism   . Anxiety   . Seizures (HCC)    Hospitalizations: No., Head Injury: No., Nervous System Infections: No., Immunizations up to date: Yes.    Diagnosed with autism at 24 years of age. Language began to regress at 18 months.   Birth History 4 lbs. 2 oz. infant born at 6234 weeks gestational age to a 23 year old gravida 2 para [id number] male  Gestation complicated by maternal one half pack per day smoking, dilated cervix in early labor requiring terbutaline  Labor was induced  Normal spontaneous vaginal delivery  The child was hospitalized for 2-1/2 weeks and had one  day of phototherapy.  He appeared to be normal up to a year of age. He walked at 15 months but regressed in his language thereafter.  Behavior History easily agitated particularly in unfamiliar surroundings  Surgical History Procedure Laterality Date  . Circumcision  1993   Family History family history includes Autism in his brother; Diabetes in his father; Seizures in his brother. Family history is negative for migraines, seizures, intellectual disabilities, blindness, deafness, birth defects, chromosomal disorder, or autism.  Social History . Marital Status: Single    Spouse Name: N/A  . Number of Children: N/A  . Years of Education: N/A   Social History Main Topics  . Smoking status: Passive Smoke Exposure - Never Smoker  . Smokeless tobacco: Never Used     Comment: Mother smokes   . Alcohol Use: No  . Drug Use: No  . Sexual Activity: No   Social History Narrative   Timothy Hogan is a high [employer] graduate. 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 He enjoys watching TV, computers, and music.   Allergies Allergen Reactions  . Penicillins Rash   Physical Exam BP 100/68 mmHg  Pulse 64  Ht 5' 7.5" (1.715 m)  Wt 154 lb 12.8 oz (70.217 kg)  BMI 23.87 kg/m2  General: alert, well developed, well nourished, in no acute distress right-handed, sandy hair, brown eyes, scruffy beard; He is wearing a headphone that diminishes environmental sound.  Head: normocephalic, no dysmorphic features  Ears, Nose and Throat: Otoscopic: tympanic membranes nonvisualized,wax occludes both . Pharynx: oropharynx is pink without  exudates or tonsillar hypertrophy.  Neck: supple, full range of motion, no cranial or cervical bruits  Respiratory: auscultation clear  Cardiovascular: no murmurs, pulses are normal  Musculoskeletal: no skeletal deformities or apparent scoliosis  Skin: no rashes or neurocutaneous lesions   Neurologic Exam  Mental Status: The patient sat quietly during history taking and was  cooperative for examination. His behavior is consistent with his autism. He has limited language, short attention span, and poor eye contact. He follows some commands but I heard very little spoken language Cranial Nerves: He is able to localize objects in his visual field and sounds out of his vision. He has symmetric facial strength midline tongue. He had positive red reflex and full extraocular movements.  Motor: Normal functional strength, tone, and mass; good fine motor movements; no pronator drift.  Sensory: intact responses to noxious stimuli and stereognosis  Coordination: good finger-to-nose, rapid repetitive alternating movements and finger apposition , no dysmetria  Gait and Station: slightly broad-based gait and station; patient is able to walk on heels, toes and tandem with difficulty; balance is adequate; Romberg exam is negative; Gower response is negative  Reflexes: symmetric and diminished bilaterally; no clonus; bilateral flexor plantar responses.  Assessment 1. Generalized convulsive epilepsy, G40.309. 2. Autism spectrum disorder with accompanying language impairment and intellectual disability, requiring very substantial support (level 3), F84.0.  Discussion Timothy Hogan is neurologically stable.  I am pleased that his seizures have been under good control in the interim since I saw him.  I refilled his prescriptions for levetiracetam and Diastat.  There is no reason to make any changes in his treatment.  Plan He will return to see me in six months' time.  I will see him sooner based on clinical need.  I spent 30 minutes of face-to-face time with Timothy Hogan and his mother, more than half of it in consultation.   Medication List   This list is accurate as of: 05/15/15  3:31 PM.       clonazePAM 0.5 MG disintegrating tablet  Commonly known as:  KLONOPIN  Take 1 tablet (0.5 mg total) by mouth daily as needed. For travel anxiety     DIASTAT ACUDIAL 20 MG Gel  Generic drug:   diazepam  Place 20 mg rectally as needed (Give 15 mg rectally 2 miutes after onset of seizure).     levETIRAcetam 100 MG/ML solution  Commonly known as:  KEPPRA  Take 5 mLs (500 mg total) by mouth 2 (two) times daily.      The medication list was reviewed and reconciled. All changes or newly prescribed medications were explained.  A complete medication list was provided to the patient/caregiver.  Deetta Perla MD

## 2015-12-06 ENCOUNTER — Other Ambulatory Visit: Payer: Self-pay

## 2015-12-06 DIAGNOSIS — G40309 Generalized idiopathic epilepsy and epileptic syndromes, not intractable, without status epilepticus: Secondary | ICD-10-CM

## 2015-12-06 MED ORDER — LEVETIRACETAM 100 MG/ML PO SOLN: 500.0000 mg | Freq: Two times a day (BID) | ORAL | Status: DC

## 2015-12-06 NOTE — Telephone Encounter (Signed)
Lvm for mom letting her know refill was sent to pharmacy.

## 2015-12-06 NOTE — Telephone Encounter (Signed)
Rx approved. Thank you for calling Mom. TG

## 2015-12-06 NOTE — Telephone Encounter (Signed)
Patient's mother called requesting a refill on Keppra 100mg /mL  CB:[phone]

## 2015-12-06 NOTE — Telephone Encounter (Signed)
[facility] called to get a verbal for a refill on patients levetiracetam 100 mg/mL 5 mLs po bid. They confirmed our office fax number. Patient is completely out of the medication. I gave verbal for 1 refill until his f/u with Dr. Rexene EdisonH on 12-18-15.

## 2015-12-18 ENCOUNTER — Encounter: Payer: Self-pay | Admitting: Pediatrics

## 2015-12-18 ENCOUNTER — Ambulatory Visit (INDEPENDENT_AMBULATORY_CARE_PROVIDER_SITE_OTHER): Payer: Medicaid Other | Admitting: Pediatrics

## 2015-12-18 VITALS — BP 134/96 | HR 88 | Ht 67.5 in | Wt 171.6 lb

## 2015-12-18 DIAGNOSIS — F840 Autistic disorder: Secondary | ICD-10-CM

## 2015-12-18 DIAGNOSIS — G40309 Generalized idiopathic epilepsy and epileptic syndromes, not intractable, without status epilepticus: Secondary | ICD-10-CM | POA: Diagnosis not present

## 2015-12-18 MED ORDER — LEVETIRACETAM 100 MG/ML PO SOLN: 500.0000 mg | Freq: Two times a day (BID) | ORAL | Status: DC

## 2015-12-18 NOTE — Progress Notes (Signed)
Patient: Timothy Hogan MRN: [id number] Sex: male DOB: 01-13-1991  Provider: Deetta PerlaHICKLING,Zilda No H, MD [facility]  Note type: Routine return visit  History of Present Illness: Referral Source: [facility] History from: mother, patient and [facility] chart Chief Complaint: Seizures  Timothy Hogan is a 25 y.o. male who returns December 18, 2015 for the first time since May 15, 2015.  He has autism spectrum disorder with intellectual disability and limited language and well controlled generalized tonic-clonic seizures.  He graduated from [employer] and is at home.  He has little to do except watch TV.  He is not able to be gainfully employed.  He is able to take care of some of his activities of daily living, but is dependent on others for much of his care.  Recently, he has had some problems with loud outbursts when he becomes angry or frustrated.  He has also had some behaviors where he would harm himself, scratching his head to the point where it causes injury.  This tends to happen most often at home with family members, but it has happened in public as well.  He has responded fairly well to clonazepam given twice or three times a day as needed for his agitation.  Mother tries to limit it and has not given him more than three doses and typically gives only two.  His seizures have been well controlled on levetiracetam and he has had no side effects.  His general health has been good.  His mother did not have other significant concerns about him today.    There are number of social issues related to deaths in the family and responsibility that she has for the estates of those who have died.  She is very concerned about how Timothy Hogan will obtain support when and if she is no longer able to do it.  His older brother has significant behavioral problems and has moved in with father.  He is also not gainfully employed, although he does not have nearly the degree  of intellectual or social disability as his brother.  Timothy Hogan is followed by [facility].  Today, we found that he has evidence of hypertension (3 separate determinations), which was not evident on previous visits.  There is no obvious explanation.  Review of Systems: 12 system review was assessed and was negative  Past Medical History Diagnosis Date  . Autism   . Anxiety   . Seizures (HCC)    Hospitalizations: No., Head Injury: No., Nervous System Infections: No., Immunizations up to date: Yes.    Diagnosed with autism at 25 years of age. Language began to regress at 18 months.   Birth History 4 lbs. 2 oz. infant born at 2834 weeks gestational age to a 24 year old gravida 2 para [id number] male  Gestation complicated by maternal one half pack per day smoking, dilated cervix in early labor requiring terbutaline  Labor was induced  Normal spontaneous vaginal delivery  The child was hospitalized for 2-1/2 weeks and had one day of phototherapy.  He appeared to be normal up to a year of age. He walked at 15 months but regressed in his language thereafter.  Behavior History autism spectrum disorder  Surgical History Procedure Laterality Date  . Circumcision  1993   Family History family history includes Autism in his brother; Diabetes in his father; Seizures in his brother. Family history is negative for migraines, intellectual disabilities, blindness, deafness, birth defects, or  chromosomal disorder.  Social History . Marital Status: Single    Spouse Name: N/A  . Number of Children: N/A  . Years of Education: N/A   Social History Main Topics  . Smoking status: Passive Smoke Exposure - Never Smoker  . Smokeless tobacco: Never Used     Comment: Mother smokes   . Alcohol Use: No  . Drug Use: No  . 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 He enjoys watching TV, computers, and music.    Allergies Allergen Reactions  . Penicillins Rash   Physical Exam BP 134/96 mmHg  Pulse 88  Ht 5' 7.5" (1.715 m)  Wt 171 lb 9.6 oz (77.837 kg)  BMI 26.46 kg/m2  General: alert, well developed, well nourished, in no acute distress right-handed, sandy hair, brown eyes, scruffy beard; He is wearing a headphone that diminishes environmental sound.  Head: normocephalic, no dysmorphic features  Ears, Nose and Throat: Otoscopic: tympanic membranes nonvisualized,wax occludes both . Pharynx: oropharynx is pink without exudates or tonsillar hypertrophy.  Neck: supple, full range of motion, no cranial or cervical bruits  Respiratory: auscultation clear  Cardiovascular: no murmurs, pulses are normal  Musculoskeletal: no skeletal deformities or apparent scoliosis  Skin: no rashes or neurocutaneous lesions   Neurologic Exam  Mental Status: The patient sat quietly for the most part during history taking and was cooperative for examination. His behavior is consistent with his autism. He has limited language, short attention span, and poor eye contact. He follows some commands but I heard very little spoken language; what was spoken was dysarthric and barely intelligible Cranial Nerves: He is able to localize objects in his visual field and sounds out of his vision. He has symmetric facial strength midline tongue. He had positive red reflex and full extraocular movements.  Motor: Normal functional strength, tone, and mass; good fine motor movements; no pronator drift.  Sensory: intact responses to noxious stimuli and stereognosis  Coordination: good finger-to-nose, rapid repetitive alternating movements and finger apposition , no dysmetria  Gait and Station: slightly broad-based gait and station; patient is able to walk on heels, toes and tandem with difficulty; balance is adequate; Romberg exam is negative; Gower response is negative  Reflexes: symmetric and diminished bilaterally; no  clonus; bilateral flexor plantar responses  Assessment 1. Generalized convulsive epilepsy, G40.309. 2. Autism spectrum disorder with accompanying language impairment, requiring very substantial support (level 3), F84.0.  Discussion Zacory is medically and neurologically stable.  I am pleased that his seizures are under control.  Plan I refilled his prescription for levetiracetam.  I will refill his prescription for clonazepam upon request.  I spent 30 minutes of face-to-face time with Timothy Hogan and his mother.  He will return to see me in six months' time sooner depending upon clinical need.    Medication List   This list is accurate as of: 12/18/15 11:59 PM.       clonazePAM 0.5 MG disintegrating tablet  Commonly known as:  KLONOPIN  Take 1 tablet (0.5 mg total) by mouth up to 3 times daily as needed. For travel anxiety     DIASTAT ACUDIAL 20 MG Gel  Generic drug:  diazepam  Give 15 mg rectally after 2 minutes of seizures or after 3 recurrent seizures within 10 minutes     levETIRAcetam 100 MG/ML solution  Commonly known as:  KEPPRA  Take 5 mLs (500 mg total) by mouth 2 (  two) times daily.      The medication list was reviewed and reconciled. All changes or newly prescribed medications were explained.  A complete medication list was provided to the patient/caregiver.  Deetta Perla MD

## 2016-07-21 ENCOUNTER — Ambulatory Visit (INDEPENDENT_AMBULATORY_CARE_PROVIDER_SITE_OTHER): Payer: Medicaid Other | Admitting: Pediatrics

## 2016-09-01 ENCOUNTER — Ambulatory Visit (INDEPENDENT_AMBULATORY_CARE_PROVIDER_SITE_OTHER): Payer: Medicaid Other | Admitting: Pediatrics

## 2016-09-01 ENCOUNTER — Encounter (INDEPENDENT_AMBULATORY_CARE_PROVIDER_SITE_OTHER): Payer: Self-pay | Admitting: Pediatrics

## 2016-09-01 DIAGNOSIS — F840 Autistic disorder: Secondary | ICD-10-CM | POA: Diagnosis not present

## 2016-09-01 DIAGNOSIS — F411 Generalized anxiety disorder: Secondary | ICD-10-CM

## 2016-09-01 DIAGNOSIS — G40309 Generalized idiopathic epilepsy and epileptic syndromes, not intractable, without status epilepticus: Secondary | ICD-10-CM | POA: Diagnosis not present

## 2016-09-01 MED ORDER — CLONAZEPAM 0.5 MG PO TBDP: 0.5000 mg | ORAL_TABLET | Freq: Every day | ORAL | 5 refills | Status: DC | PRN

## 2016-09-01 MED ORDER — LEVETIRACETAM 100 MG/ML PO SOLN: 500.0000 mg | Freq: Two times a day (BID) | ORAL | 5 refills | Status: DC

## 2016-09-01 NOTE — Progress Notes (Signed)
Patient: Timothy Hogan MRN: [id number] Sex: male DOB: 06-09-1991  Provider: Ellison CarwinWilliam Lille Karim, MD [facility]  Note type: Routine return visit  History of Present Illness: Referral Source: [facility] History from: mother, patient and [facility] chart Chief Complaint: Seizures  Timothy DearJustin B Hogan is a 26 y.o. male who returns September 01, 2016, for the first time since December 18, 2015.  He has autism spectrum disorder with intellectual disability and limited language and well-controlled generalized tonic-clonic seizure disorder.  Since his last visit, the patient has had no seizures.  He takes and tolerates levetiracetam without side effects.  He takes clonazepam when he goes out in public.  This usually makes it possible for him to get through an appointment without having excessive agitation.  His mother told me about a recent visit where they were kept waiting in their primary physician's office at [facility].  After an hour she walked out and complained.  It was very important for her to make the point to her primary physician that her son with autism does not do well in unfamiliar surroundings and when he is anxious.  In our office, we take him through the initial check-in and into the room within minutes and I see him promptly.  This results in a child who is able to cooperate and who did as well today as I can remember.  [name] lives at home with his family.  His mother is not able to let him get out into the community very often because she does not have [employer] funding.  His general health has been good.  As I mentioned, he had no seizures.  He has taken and tolerated this medicine well.  He has good appetite.  His weight is stable.  He has lost about three pounds.  He is sleeping well.  Mother had no other concerns today.  Review of Systems: 12 system review was assessed and was negative  Past Medical History Diagnosis Date  . Anxiety   .  Autism   . Seizures (HCC)    Hospitalizations: No., Head Injury: No., Nervous System Infections: No., Immunizations up to date: Yes.    Diagnosed with autism at 26 years of age. Language began to regress at 18 months.   Birth History 4 lbs. 2 oz. infant born at 534 weeks gestational age to a 25 year old gravida 2 para 190010 male  Gestation complicated by maternal one half pack per day smoking, dilated cervix in early labor requiring terbutaline  Labor was induced  Normal spontaneous vaginal delivery  The child was hospitalized for 2-1/2 weeks and had one day of phototherapy.  He appeared to be normal up to a year of age. He walked at 15 months but regressed in his language thereafter.  Behavior History Autism spectrum disorder  Surgical History Procedure Laterality Date  . CIRCUMCISION  1993   Family History family history includes Autism in his brother; Diabetes in his father; Seizures in his brother. Family history is negative for migraines, intellectual disabilities, blindness, deafness, birth defects, chromosomal disorder, or autism.  Social History . Marital status: Single    Spouse name: N/A  . Number of children: N/A  . Years of education: N/A   Social History Main Topics  . Smoking status: Passive Smoke Exposure - Never Smoker  . Smokeless tobacco: Never Used     Comment: Mother smokes   . Alcohol use No  . Drug use: No  . 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     He enjoys watching TV, computers, and music.   Allergies Allergen Reactions  . Penicillins Rash   Physical Exam BP 110/70   Pulse 80   Ht 5' 7.5" (1.715 m)   Wt 168 lb 12.8 oz (76.6 kg)   BMI 26.05 kg/m   General: alert, well developed, well nourished, in no acute distress, sandy hair, brown eyes, right handed Head: normocephalic, no dysmorphic features Ears, Nose and Throat: Otoscopic: tympanic membranes  normal; pharynx: oropharynx is pink without exudates or tonsillar hypertrophy Neck: supple, full range of motion, no cranial or cervical bruits Respiratory: auscultation clear Cardiovascular: no murmurs, pulses are normal Musculoskeletal: no skeletal deformities or apparent scoliosis Skin: no rashes or neurocutaneous lesions  Neurologic Exam  Mental Status: alert; oriented to person; knowledge is below normal for age; language is limited any: He makes poor eye contact.  He follows some simple commands any: He spoke somewhat but it was mumbled and at times echolalic Cranial Nerves: visual fields are full to double simultaneous stimuli; extraocular movements are full and conjugate; pupils are round reactive to light; funduscopic examination shows positive red reflex bilaterally; symmetric facial strength; midline tongue and uvula; he turns to localize sound bilaterally Motor: normal functional strength, tone and mass; good fine motor movements; no pronator drift Sensory: withdrawal 4 Coordination: no tremor and reaching for objects no obvious dysmetria Gait and Station: slightly broad-based but stable gait and station; balance is adequate; Romberg exam is negative Reflexes: symmetric and diminished bilaterally; no clonus; bilateral flexor plantar responses  Assessment 1. Autism spectrum disorder with accompanying language impairment, requiring very substantial support (level 3), F84.0. 2. Anxiety state, F41.1. 3. Generalized convulsive epilepsy, G40.309.  Discussion I am pleased that Josua is doing so well.    Plan I asked his mother to sign up for MyChart so that she communicate with our office via text.  I spent 30 minutes of face-to-face time with the patient and his mother.  I refilled his prescription for levetiracetam and for clonazepam.  He will return to see me in six months' time.   Medication List   Accurate as of 09/01/16  3:59 PM.      clonazePAM 0.5 MG disintegrating  tablet Commonly known as:  KLONOPIN Take 1 tablet (0.5 mg total) by mouth daily as needed. For travel anxiety   DIASTAT ACUDIAL 20 MG Gel Generic drug:  diazepam Give 15 mg rectally after 2 minutes of seizures or after 3 recurrent seizures within 10 minutes   levETIRAcetam 100 MG/ML solution Commonly known as:  KEPPRA Take 5 mLs (500 mg total) by mouth 2 (two) times daily.    The medication list was reviewed and reconciled. All changes or newly prescribed medications were explained.  A complete medication list was provided to the patient/caregiver.  Deetta Perla MD

## 2016-09-01 NOTE — Patient Instructions (Signed)
If you have time please sign up for My Chart said that you can communicate with our office via text.

## 2017-03-26 ENCOUNTER — Other Ambulatory Visit (INDEPENDENT_AMBULATORY_CARE_PROVIDER_SITE_OTHER): Payer: Self-pay | Admitting: Pediatrics

## 2017-03-26 DIAGNOSIS — G40309 Generalized idiopathic epilepsy and epileptic syndromes, not intractable, without status epilepticus: Secondary | ICD-10-CM

## 2017-05-28 ENCOUNTER — Other Ambulatory Visit (INDEPENDENT_AMBULATORY_CARE_PROVIDER_SITE_OTHER): Payer: Self-pay | Admitting: Pediatrics

## 2017-05-28 DIAGNOSIS — G40309 Generalized idiopathic epilepsy and epileptic syndromes, not intractable, without status epilepticus: Secondary | ICD-10-CM

## 2017-07-09 ENCOUNTER — Other Ambulatory Visit (INDEPENDENT_AMBULATORY_CARE_PROVIDER_SITE_OTHER): Payer: Self-pay | Admitting: Pediatrics

## 2017-07-09 DIAGNOSIS — G40309 Generalized idiopathic epilepsy and epileptic syndromes, not intractable, without status epilepticus: Secondary | ICD-10-CM

## 2017-08-24 ENCOUNTER — Ambulatory Visit (INDEPENDENT_AMBULATORY_CARE_PROVIDER_SITE_OTHER): Payer: Self-pay | Admitting: Pediatrics

## 2017-09-02 ENCOUNTER — Ambulatory Visit (INDEPENDENT_AMBULATORY_CARE_PROVIDER_SITE_OTHER): Payer: Self-pay | Admitting: Pediatrics

## 2017-10-02 ENCOUNTER — Other Ambulatory Visit (INDEPENDENT_AMBULATORY_CARE_PROVIDER_SITE_OTHER): Payer: Self-pay | Admitting: Pediatrics

## 2017-10-02 DIAGNOSIS — G40309 Generalized idiopathic epilepsy and epileptic syndromes, not intractable, without status epilepticus: Secondary | ICD-10-CM

## 2017-10-02 MED ORDER — LEVETIRACETAM 100 MG/ML PO SOLN: ORAL | 0 refills | Status: DC

## 2017-10-02 NOTE — Telephone Encounter (Signed)
Mom stated that pt is completely out of medication, would like refill request completed today please.   Mom also called back to update pharmacy information.  [facility]  [phone]   Also stated that pt is completely out of medication, would like refill request completed today please.

## 2017-10-02 NOTE — Telephone Encounter (Signed)
°  Who's calling (name and relationship to patient) : Sharyl NimrodMeredith (Mother) Best contact number: [phone] Provider they see: Dr. Sharene SkeansHickling Reason for call: Mom called requesting a refill on Keppra.

## 2017-10-02 NOTE — Telephone Encounter (Signed)
Rx has been electronically sent to the pharmacy 

## 2017-10-21 ENCOUNTER — Ambulatory Visit (INDEPENDENT_AMBULATORY_CARE_PROVIDER_SITE_OTHER): Payer: Medicaid Other | Admitting: Pediatrics

## 2017-10-21 ENCOUNTER — Encounter (INDEPENDENT_AMBULATORY_CARE_PROVIDER_SITE_OTHER): Payer: Self-pay | Admitting: Pediatrics

## 2017-10-21 VITALS — BP 120/70 | HR 88 | Ht 67.0 in | Wt 157.6 lb

## 2017-10-21 DIAGNOSIS — F840 Autistic disorder: Secondary | ICD-10-CM | POA: Diagnosis not present

## 2017-10-21 DIAGNOSIS — G40309 Generalized idiopathic epilepsy and epileptic syndromes, not intractable, without status epilepticus: Secondary | ICD-10-CM | POA: Diagnosis not present

## 2017-10-21 MED ORDER — LEVETIRACETAM 100 MG/ML PO SOLN: ORAL | 5 refills | Status: DC

## 2017-10-21 NOTE — Patient Instructions (Signed)
I am pleased that Timothy Hogan is doing so well.  Please let me know if there is anything that she needed including prescription refills.  I will see him again in a year unless you need me to see him sooner.

## 2017-10-21 NOTE — Progress Notes (Signed)
Patient: Timothy Hogan MRN: [id number] Sex: male DOB: 10-Mar-1991  Provider: [name], MD Location of Care: [facility]  Note type: Routine return visit  History of Present Illness: Referral Source: TAPM History from: mother and [facility] chart Chief Complaint: Seizures  Timothy Hogan is a 27 y.o. male who returns on October 21, 2017, for the first time since September 01, 2016.  [name] has autism spectrum disorder with intellectual disability and limited language.  He has well controlled generalized tonic-clonic seizures.  He takes and tolerates levetiracetam without side effects.  In the past, he has taken clonazepam when he goes out in the public.  He has not needed diazepam gel in quite some time because his seizures have been in such good control.  [name] was somewhat anxious today and frequently cried out.  He seemed to get very excited during the examination.  We were able to work through that and get him calmed down so that I can continue the exam.  Overall, he was fully cooperative, was certainly able to understand what I said.  He had a lot of echolalia, but after fashioned was able to communicate some of the things that he was thinking.  His general health is good.  He sleeps well.  He is now home with his mother, and she tries the best that she can to get him out of the house to visit relatives, to go to the grocery store, and shopping elsewhere.  I am very pleased that his seizures remain in good control.  It is interesting that he has lost 10 pounds in 14 months.  I think that this is correct because he lost 3 pounds between December 18, 2015, and September 01, 2016.  He looks good, he seems to be sleeping well.  His mother voiced no concerns today.  Review of Systems: A complete review of systems was assessed and was negative.  Past Medical History Diagnosis Date  . Anxiety   . Autism   . Seizures (HCC)    Hospitalizations: No., Head Injury: No., Nervous  System Infections: No., Immunizations up to date: Yes.    Diagnosed with autism at 28 years of age. Language began to regress at 18 months.   Birth History 4 lbs. 2 oz. infant born at [redacted] weeks gestational age to a 26 year old gravida 2 para 64 male  Gestation complicated by maternal one half pack per day smoking, dilated cervix in early labor requiring terbutaline  Labor was induced  Normal spontaneous vaginal delivery  The child was hospitalized for 2-1/2 weeks and had one day of phototherapy.  He appeared to be normal up to a year of age. He walked at 15 months but regressed in his language thereafter.  Behavior History Autism spectrum disorder  Surgical History Procedure Laterality Date  . CIRCUMCISION  1993   Family History family history includes Autism in his brother; Diabetes in his father; Seizures in his brother. Family history is negative for migraines, intellectual disabilities, blindness, deafness, birth defects, or chromosomal disorder.  Social History Social Needs  . Financial resource strain: Not on file  . Food insecurity:    Worry: Not on file    Inability: Not on file  . Transportation needs:    Medical: Not on file    Non-medical: Not on file  Tobacco Use  . Smoking status: Passive Smoke Exposure - Never Smoker  . Tobacco comment: Mother smokes   Substance and Sexual Activity  . Alcohol use:  No    Alcohol/week: 0.0 oz  . Drug use: No  . Sexual activity: Never  Social History Narrative   [name] is a high [employer] graduate.    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    He enjoys watching TV, computers, and music.   Allergies Allergen Reactions  . Penicillins Rash   Physical Exam BP 120/70   Pulse 88   Ht 5\' 7"  (1.702 m)   Wt 157 lb 9.6 oz (71.5 kg)   BMI 24.68 kg/m   General: alert, well developed, well nourished, in no acute distress, sandy hair, brown eyes, right handed Head: normocephalic, no dysmorphic features he is wearing sound; reduction  headphones Ears, Nose and Throat: Otoscopic: tympanic membranes normal; pharynx: oropharynx is pink without exudates or tonsillar hypertrophy Neck: supple, full range of motion, no cranial or cervical bruits Respiratory: auscultation clear Cardiovascular: no murmurs, pulses are normal Musculoskeletal: no skeletal deformities or apparent scoliosis Skin: no rashes or neurocutaneous lesions  Neurologic Exam  Mental Status: alert; oriented to person; knowledge is below normal for age; language is limited, he was able to follow commands, he mumbles, cries out, and has echolalia makes poor eye contact Cranial Nerves: visual fields are full to double simultaneous stimuli; extraocular movements are full and conjugate; pupils are round reactive to light; funduscopic examination shows positive red reflex bilaterally; symmetric facial strength; midline tongue and uvula; he turns to localize sound bilaterally Motor: Normal functional strength, tone and mass; good fine motor movements; cannot test pronator drift Sensory: intact responses to cold, vibration, proprioception and stereognosis Coordination: good finger-to-nose, rapid repetitive alternating movements and finger apposition Gait and Station: slightly broad-based but stable gait and station: patient is able to walk on heels, toes and tandem without difficulty; balance is adequate; Romberg exam is negative; Gower response is negative Reflexes: symmetric and diminished bilaterally; no clonus; bilateral flexor plantar responses  Assessment 1. Autism spectrum disorder with intellectual disability and accompanying language impairment requiring very substantial support, level 3, F84.0. 2. Anxiety state, F41.1. 3. Generalized convulsive epilepsy, G40.309.  Discussion I am pleased that [name] is doing well as regards to seizure control.  I think he is doing fairly well as regards to his behavior.  I am glad that his mother sees the need for him to have an  active life and gets him out of the house.   Plan I refilled his prescription for levetiracetam and also for clonazepam.  He will return to see me in a year, but I will see him sooner based on clinical need.  I spent 25 minutes of face-to-face time with Clois's mother discussing his current activities, his intermittent agitation, and the need for good compliance with medication.   Medication List    Accurate as of 10/21/17  2:47 PM.      clonazePAM 0.5 MG disintegrating tablet Commonly known as:  KLONOPIN Take 1 tablet (0.5 mg total) by mouth daily as needed. For travel anxiety   DIASTAT ACUDIAL 20 MG Gel Generic drug:  diazepam Give 15 mg rectally after 2 minutes of seizures or after 3 recurrent seizures within 10 minutes   levETIRAcetam 100 MG/ML solution Commonly known as:  KEPPRA TAKE 1 TEASPOONFUL (5MLS) BY MOUTH 2 TIMES DAILY    The medication list was reviewed and reconciled. All changes or newly prescribed medications were explained.  A complete medication list was provided to the patient/caregiver.  Deetta PerlaWilliam H Zelda Reames MD

## 2017-12-28 ENCOUNTER — Telehealth (INDEPENDENT_AMBULATORY_CARE_PROVIDER_SITE_OTHER): Payer: Self-pay | Admitting: Pediatrics

## 2017-12-28 NOTE — Telephone Encounter (Signed)
I informed mom that this would be up to Dr. Sharene SkeansHickling to decide whether he will write the letter or not. She understood

## 2017-12-28 NOTE — Telephone Encounter (Signed)
Mom returned Dr. Darl HouseholderHickling's call. She stated that the best number to reach her tomorrow would be [phone].

## 2017-12-28 NOTE — Telephone Encounter (Signed)
°  Who (name and relationship to patient) : Timothy Hogan (Mother) Best contact number: [phone] (tonight) or [phone](tomorrow until 3pm) Provider they see: Dr. Sharene SkeansHickling Reason for call: Mom stated that DSS needs a letter stating that mom is a stay at home mom due to caring for pt so that pt's benefits won't be discontinued. Mom can be reached tonight at the first number provided. Mom can be reached tomorrow using the second number provided until 3pm and then can be reached at the first number again after 3pm.

## 2017-12-28 NOTE — Telephone Encounter (Signed)
I need more information as to why the letter needs to be written.  I am certain the family has it.

## 2017-12-29 NOTE — Telephone Encounter (Signed)
I called mother and told her that I needed to see the letter that told her she was losing benefits so that I could write a letter that addressed the concerns.  She said that she would get it to the office.  I will not write her letter before I see that.

## 2017-12-30 NOTE — Telephone Encounter (Signed)
Mom dropped off letter regarding lose of benefits. Letter has been placed in box up front.

## 2017-12-30 NOTE — Telephone Encounter (Signed)
Letter has been placed on Dr. Darl HouseholderHickling's desk

## 2017-12-31 ENCOUNTER — Encounter (INDEPENDENT_AMBULATORY_CARE_PROVIDER_SITE_OTHER): Payer: Self-pay | Admitting: Pediatrics

## 2017-12-31 NOTE — Progress Notes (Signed)
To whom it may concern:  Re: [name] (DOB: 05/26/1981)  [name] is been a patient of mine for a number of years.  He has autism spectrum disorder with intellectual disability and limited language.  He has well-controlled generalized tonic-clonic seizures.  I last saw him October 21, 2017.  [name] is unable to care for himself.  He cannot be left alone.  He requires constant supervision because of his inability to understand cause-and-effect of his behavior.  He has significant problems with anxiety and intermittently becomes emotionally out of control.  His mother stays at home to provide care to him.  She has no other alternative.  She is able to provide meals, clean him after elimination, and keep him from causing injury to himself and objects within the home.  It is medically necessary for him to have constant supervision.  In your letter of Dec 11, 2017 you stated that he "no longer qualifies for [employer] benefits" because the "household composition changed".  Benefits would be reduced by $137 per month.  As best I understand, nothing has changed in the home.  Mother is unable to work because she must be at home with her son.  I request that this adverse action the reviewed and reversed.  If you have questions or I can be of assistance or further clarify his situation, do not hesitate to contact me.          Sincerely yours,            Deanna ArtisWilliam H. Sharene SkeansHickling, MD

## 2018-01-01 NOTE — Telephone Encounter (Signed)
Dad came by to pick up letter and saw that pt's DOB was incorrect. DOB should be 05-08-1991. Letter was placed in Provider's box for correction. Dad will come back on Monday to pick up corrected letter.

## 2018-01-03 ENCOUNTER — Encounter (INDEPENDENT_AMBULATORY_CARE_PROVIDER_SITE_OTHER): Payer: Self-pay | Admitting: Pediatrics

## 2018-01-03 NOTE — Progress Notes (Unsigned)
Correction of letter

## 2018-01-04 NOTE — Telephone Encounter (Signed)
Letter has been printed and placed on Dr. Darl HouseholderHickling's desk

## 2018-01-04 NOTE — Telephone Encounter (Signed)
Letter has been picked up by dad

## 2018-04-03 ENCOUNTER — Other Ambulatory Visit (INDEPENDENT_AMBULATORY_CARE_PROVIDER_SITE_OTHER): Payer: Self-pay | Admitting: Pediatrics

## 2018-04-03 DIAGNOSIS — F840 Autistic disorder: Secondary | ICD-10-CM

## 2018-04-03 DIAGNOSIS — F411 Generalized anxiety disorder: Secondary | ICD-10-CM

## 2018-04-03 NOTE — Telephone Encounter (Signed)
Mom wants a medication refill his clonazepam.  He was last seen October 21, 2017.

## 2018-04-05 MED ORDER — CLONAZEPAM 0.5 MG PO TBDP: 0.5000 mg | ORAL_TABLET | Freq: Every day | ORAL | 5 refills | Status: DC | PRN

## 2018-04-05 NOTE — Telephone Encounter (Signed)
Mom stated pt needs refill on Clonazepam to be [facility].

## 2018-04-05 NOTE — Telephone Encounter (Signed)
I sent in refill for patient. TG 

## 2018-04-06 ENCOUNTER — Telehealth (INDEPENDENT_AMBULATORY_CARE_PROVIDER_SITE_OTHER): Payer: Self-pay | Admitting: Pediatrics

## 2018-04-06 NOTE — Telephone Encounter (Signed)
Rx faxed to the pharmacy. I left a message asking mother to call back to let her know it was sent.

## 2018-04-06 NOTE — Telephone Encounter (Signed)
Called and let mother know I faxed the Rx in the previous note to the pharmacy.

## 2018-04-06 NOTE — Telephone Encounter (Signed)
°  Who's calling (name and relationship to patient) : Meredith/Mom  Best contact number: [phone]  Provider they see: Dr Sharene SkeansHickling  Reason for call: Mom returned call to RN/Chelsea, she left her a message earlier and she is requesting a call back please.

## 2018-05-12 ENCOUNTER — Ambulatory Visit (INDEPENDENT_AMBULATORY_CARE_PROVIDER_SITE_OTHER): Payer: Medicaid Other

## 2018-05-26 ENCOUNTER — Ambulatory Visit (INDEPENDENT_AMBULATORY_CARE_PROVIDER_SITE_OTHER): Payer: Medicaid Other | Admitting: Pediatrics

## 2018-05-26 ENCOUNTER — Encounter (INDEPENDENT_AMBULATORY_CARE_PROVIDER_SITE_OTHER): Payer: Self-pay | Admitting: Pediatrics

## 2018-05-26 VITALS — BP 110/78 | HR 72 | Ht 67.0 in | Wt 140.0 lb

## 2018-05-26 DIAGNOSIS — F411 Generalized anxiety disorder: Secondary | ICD-10-CM

## 2018-05-26 DIAGNOSIS — G40309 Generalized idiopathic epilepsy and epileptic syndromes, not intractable, without status epilepticus: Secondary | ICD-10-CM | POA: Diagnosis not present

## 2018-05-26 DIAGNOSIS — F840 Autistic disorder: Secondary | ICD-10-CM | POA: Diagnosis not present

## 2018-05-26 MED ORDER — CLONAZEPAM 0.5 MG PO TBDP: ORAL_TABLET | ORAL | 5 refills | Status: DC

## 2018-05-26 MED ORDER — LEVETIRACETAM 100 MG/ML PO SOLN: ORAL | 5 refills | Status: DC

## 2018-05-26 NOTE — Progress Notes (Deleted)
Patient: Timothy DearJustin B Fleeger MRN: [id number] Sex: male DOB: February 10, 1991  Provider: Ellison CarwinWilliam , MD Location [facility] Care[facility]  Note type: Routine return visit  History of Present Illness: Referral Source: TAPM History from: mother, patient and [facility] chart Chief Complaint: Seizures  Timothy Hogan is a 27 y.o. male who ***  Review of Systems: A complete review of systems was unremarkable.  Past Medical History Past Medical History:  Diagnosis Date  . Anxiety   . Autism   . Seizures (HCC)    Hospitalizations: No., Head Injury: No., Nervous System Infections: No., Immunizations up to date: Yes.    ***  Birth History *** lbs. *** oz. infant born at *** weeks gestational age to a *** year old g *** p *** *** *** *** male. Gestation was {Complicated/Uncomplicated Pregnancy:20185} Mother received {CN Delivery analgesics:210120005}  {method of delivery:313099} Nursery Course was {Complicated/Uncomplicated:20316} Growth and Development was {cn recall:210120004}  Behavior History {Symptoms; behavioral problems:18883}  Surgical History Past Surgical History:  Procedure Laterality Date  . CIRCUMCISION  1993    Family History family history includes Autism in his brother; Diabetes in his father; Seizures in his brother. Family history is negative for migraines, seizures, intellectual disabilities, blindness, deafness, birth defects, chromosomal disorder, or autism.  Social History Social History   Socioeconomic History  . Marital status: Single    Spouse name: Not on file  . Number of children: Not on file  . Years of education: Not on file  . Highest education level: Not on file  Occupational History  . Not on file  Social Needs  . Financial resource strain: Not on file  . Food insecurity:    Worry: Not on file    Inability: Not on file  . Transportation needs:    Medical: Not on file    Non-medical: Not on file  Tobacco Use  . Smoking  status: Passive Smoke Exposure - Never Smoker  . Smokeless tobacco: Never Used  . Tobacco comment: Mother smokes   Substance and Sexual Activity  . Alcohol use: No    Alcohol/week: 0.0 standard drinks  . Drug use: No  . Sexual activity: Never  Lifestyle  . Physical activity:    Days per week: Not on file    Minutes per session: Not on file  . Stress: Not on file  Relationships  . Social connections:    Talks on phone: Not on file    Gets together: Not on file    Attends religious service: Not on file    Active member of club or organization: Not on file    Attends meetings of clubs or organizations: Not on file    Relationship status: Not on file  Other Topics Concern  . Not on file  Social History Narrative   [name] is a high [employer] graduate.    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    He enjoys watching TV, computers, and music.     Allergies Allergies  Allergen Reactions  . Penicillins Rash    Physical Exam BP 110/78   Pulse 72   Ht 5\' 7"  (1.702 m)   Wt 140 lb (63.5 kg)   BMI 21.93 kg/m   ***   Assessment   Discussion   Plan  Allergies as of 05/26/2018      Reactions   Penicillins Rash      Medication List        Accurate as of 05/26/18  3:55 PM. Always  use your most recent med list.          clonazePAM 0.5 MG disintegrating tablet Commonly known as:  KLONOPIN Take 1 tablet (0.5 mg total) by mouth daily as needed. For travel anxiety   DIASTAT ACUDIAL 20 MG Gel Generic drug:  diazepam Give 15 mg rectally after 2 minutes of seizures or after 3 recurrent seizures within 10 minutes   levETIRAcetam 100 MG/ML solution Commonly known as:  KEPPRA TAKE 1 TEASPOONFUL ( ) BY MOUTH 2 TIMES DAILY       The medication list was reviewed and reconciled. All changes or newly prescribed medications were explained.  A complete medication list was provided to the patient/caregiver.  Deetta Perla MD

## 2018-05-26 NOTE — Patient Instructions (Signed)
He was great to see you today.  Wish just in a happy birthday for me.  Very happy about your special person.  Keep your eye on the response to clonazepam.  If he starts to get "used" to the medication we may have to use a different medicine.

## 2018-05-26 NOTE — Progress Notes (Addendum)
Patient: Timothy Hogan: [id number] Sex: male DOB: [birth year]  Provider: [name], MD Location of Care: [facility]  Note type: Routine return visit  History of Present Illness:  Timothy Hogan is a 27 y.o. male with a history of autism, intellectual disability, limited language, and well-controlled tonic clonic seizures. He was last seen in neurologic clinic in April, at which time he was prescribed 1 tablet (0.5mg ) of clonazepam daily. Mother reports that since this last visit she has needed to give him 0.5mg  of clonazepam in the morning and 1mg  of clonazepam at 9pm for anxiety and agitation. She will run of out the clonazepam soon and requests enough to give him 1.5mg  daily.   Since [name] is no longer in school, his mother has been taking him with her during the day to visit relatives or run errands.  She feels that it is important for [name] to get out of the house. However, he has anxiety and agitation while in the car. He is particularly bothered by color changes between the road and other surfaces, such as bridges. He also gets very agitated while they are stuck in traffic or take routes that are unfamiliar to [name], and will shake his entire body and start sweating. It helps for him to sit in the front seat and have control of the radio, since he does not have control of other factors while driving. Mother is very worried he will hurt himself by kicking the dashboard but reports that he is always buckled in and she has no concerns that he will hurt her or cause an accident while she is driving.   [name] also has behavioral problems when he is with his dad on Saturdays. His father has fewer rules for him so [name] tends to act out more. Mother also reports some difficulties with sleep, and says that if she doesn't give [name] 1mg  of clonazepam at bedtime he will stay awake and she will hear him talking to himself for hours.  She is also concerned that he has lost  weight (12-17lb) over the past year, which she does not understand since he eats very well.   He also has been picking at a hangnail on his finger (R pinky) for some time so it never is able to heal. She tries to keep this covered with a bandaid.  At home, [name]'s mother helps him with bathing and prepares his meals, but he is able to go to the bathroom independently. When he is home, [name] likes to play on the computer or watch football.  [name] has not had any seizures since his last visit. He is taking the levetiracetam as directed with no missed doses and his family has diazepam gel at home.   Review of Systems: A complete review of systems was assessed and was negative.  Past Medical History Diagnosis Date  . Anxiety   . Autism   . Seizures (HCC)    Hospitalizations: no Head Injury: no Nervous System Infections: no  Immunizations up to date: Yes.    Birth History 4 lbs. 2 oz. infant born at [redacted] weeks gestational age to a 26 year old gravida 2 para 72 male  Gestation complicated by maternal one half pack per day smoking, dilated cervix in early labor requiring terbutaline  Labor was induced  Normal spontaneous vaginal delivery  The child was hospitalized for 2-1/2 weeks and had one day of phototherapy.  He appeared to be normal up to a year of  age. He walked at 15 months but regressed in his language thereafter.  Behavior History Autism spectrum disorder, level 3  Surgical History Procedure Laterality Date  . CIRCUMCISION  1993   Family History family history includes Autism in his brother; Diabetes in his father; Seizures in his brother. Family history is negative for migraines, intellectual disabilities, blindness, deafness, birth defects, chromosomal disorder, or autism.  Social History Social Needs  . Financial resource strain: Not on file  . Food insecurity:    Worry: Not on file    Inability: Not on file  . Transportation needs:    Medical: Not on  file    Non-medical: Not on file  Tobacco Use  . Smoking status: Passive Smoke Exposure - Never Smoker  . Smokeless tobacco: Never Used  . Tobacco comment: Mother smokes   Substance and Sexual Activity  . Alcohol use: No    Alcohol/week: 0.0 standard drinks  . Drug use: No  . 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     He enjoys watching TV, computers, and music.   Mother reports she has recently started dating her high school sweetheart after being single for 11 years  Allergies Allergen Reactions  . Penicillins Rash   Physical Exam BP 110/78   Pulse 72   Ht 5\' 7"  (1.702 m)   Wt 140 lb (63.5 kg)   BMI 21.93 kg/m   General: Well-developed, thin young man in no acute distress, brown hair, hazel eyes, right handed Head: Normocephalic. No dysmorphic features. Wearing sound reduction headphones.  Ears, Nose and Throat: No signs of infection in conjunctivae or oropharynx Neck: Supple neck with full range of motion Respiratory: Lungs clear to auscultation. Cardiovascular: Regular rate and rhythm, no murmurs, gallops, or rubs; pulses normal in the upper and lower extremities Musculoskeletal: No deformities or apparent scoliosis Skin: hangnail on R pinky finger, not currently bleeding.   Neurologic Exam  Mental Status: Awake and alert. Follows 50% of commands. Speech is a mixture of nonverbal sounds, echolalia, and single words. He tends to repeat a single word multiple times in a row. Poor eye contact.  Cranial Nerves: Pupils equal, round, and reactive to light; EOM intact, visual fields full. Symmetric facial strength; midline tongue and uvula. Does not want to remove noise-canceling headphones but turns to localize sounds.  Motor: Normal functional strength, tone, mass, good fine motor movements, unable to test pronator drift.  Sensory: Grossly intact.  Coordination: No tremor, dystaxia on reaching  for objects. Normal gait with no ataxia. Unable to follow commands for tandem walk or coordination testing.  Reflexes: 3+ patellar, biceps, and brachioradiasis reflexes bilaterally  Assessment 1.  Autism spectrum disorder with accompanying language impairment, requiring very substantial support (level 3), F84.0. 2.  Generalized convulsive epilepsy, G40.309. 3.  Anxiety state, F41.1.  Discussion Troyce has not had any seizures and he is taking his levetiracetam as directed with no missed doses. He has had problematic anxiety and agitation in the car as well as agitation at bedtime and when with his father. These symptoms are well managed with 0.5mg  of clonazepam in the morning and 1mg  at bedtime. Discussed the risks of medication tolerance with mother and she verbalized understanding. We will prescribe 1.5mg  of clonazepam daily for now per mother's preference, with close monitoring of possible decreases in effectiveness with time. Montario's agitation in the car while sitting in the  front seat raises some concerns for safety, however mother reports that he is always buckled in, has never hit her, and that she does not think there is a risk of him causing an accident. We are pleased that [name] is getting out of the house to accompany his mother as she goes about her day.   Plan  Seizure Disorder: -Continue levetiracetam 500mg  daily -Family instructed to always keep rectal diazepam on hand in case of seizure  Anxiety/Agitation: -Will increase clonazepam dose to 0.5mg  in AM and 1mg  at bedtime -Closely monitor for decreases in effectiveness over time  Weight loss: -Mother reports he has a normal diet and appetite, so medical intervention is not needed at this time -Continue to follow closely   Medication List    Accurate as of 05/26/18 11:59 PM.      clonazePAM 0.5 MG disintegrating tablet Commonly known as:  KLONOPIN Take 1 in the morning and 2 in the evening.   DIASTAT ACUDIAL 20 MG  Gel Generic drug:  diazepam Give 15 mg rectally after 2 minutes of seizures or after 3 recurrent seizures within 10 minutes   levETIRAcetam 100 MG/ML solution Commonly known as:  KEPPRA TAKE 1 TEASPOONFUL (5MLS) BY MOUTH 2 TIMES DAILY    The medication list was reviewed and reconciled. All changes or newly prescribed medications were explained.  A complete medication list was provided to the patient/caregiver.  Greater than 50% of a 25-minute visit was spent in counseling and coordination of care.  I supervised Dr. Juline PatchMannschrek.  I reviewed her notes and agree except as amended.  I performed physical examination, participated in history taking, and guided decision making.  I prescribed the new dose of clonazepam and sent it to the pharmacy.  I refilled his levetiracetam.  He will return in 6 months for routine visit.  Otila Kluveriana Mannschrek, [facility] [address]  Deetta PerlaWilliam H Hickling MD

## 2018-12-24 ENCOUNTER — Other Ambulatory Visit (INDEPENDENT_AMBULATORY_CARE_PROVIDER_SITE_OTHER): Payer: Self-pay | Admitting: Pediatrics

## 2018-12-24 DIAGNOSIS — F411 Generalized anxiety disorder: Secondary | ICD-10-CM

## 2018-12-24 DIAGNOSIS — F840 Autistic disorder: Secondary | ICD-10-CM

## 2019-01-03 ENCOUNTER — Other Ambulatory Visit (INDEPENDENT_AMBULATORY_CARE_PROVIDER_SITE_OTHER): Payer: Self-pay | Admitting: Pediatrics

## 2019-01-03 DIAGNOSIS — F840 Autistic disorder: Secondary | ICD-10-CM

## 2019-01-03 DIAGNOSIS — F411 Generalized anxiety disorder: Secondary | ICD-10-CM

## 2019-01-03 MED ORDER — CLONAZEPAM 0.5 MG PO TBDP: ORAL_TABLET | ORAL | 0 refills | Status: DC

## 2019-01-03 NOTE — Telephone Encounter (Signed)
Please send to the pharmacy °

## 2019-01-03 NOTE — Telephone Encounter (Signed)
°  Who's calling (name and relationship to patient) : Guenther Dunshee, mom  Best contact number: [phone]  Provider they see: Dr. Gaynell Face  Reason for call: Mom states that the pharmacy has sent [place] requests for a refill for a medication called clonazepam, and the pharmacy and mom are both stating that we have not responded, and this has been requested since beginning of last week. Pharmacy phon number is [phone]. He has been out of medication since last week. Please advise.    PRESCRIPTION REFILL ONLY  Name of prescription: Clonazepam  Pharmacy: [address] [place], [place]

## 2019-01-04 ENCOUNTER — Telehealth (INDEPENDENT_AMBULATORY_CARE_PROVIDER_SITE_OTHER): Payer: Self-pay | Admitting: Pediatrics

## 2019-01-04 NOTE — Telephone Encounter (Signed)
Spoke with mom to inform her that the PA has been done and approved. Also, Scheduled the patient for a follow up

## 2019-01-04 NOTE — Telephone Encounter (Signed)
°  Who's calling (name and relationship to patient) : Ailene Ravel (Mother) Best contact number: [phone] Provider they see: Dr. Gaynell Face Reason for call: Mom stated pt's rx needs prior auth initiated. Mom would like a return call back confirming when this has been done.      PRESCRIPTION REFILL ONLY  Name of prescription: Clonazepam [facility]  [address]

## 2019-02-01 ENCOUNTER — Ambulatory Visit (INDEPENDENT_AMBULATORY_CARE_PROVIDER_SITE_OTHER): Payer: Medicaid Other | Admitting: Pediatrics

## 2019-02-01 ENCOUNTER — Encounter (INDEPENDENT_AMBULATORY_CARE_PROVIDER_SITE_OTHER): Payer: Self-pay | Admitting: Pediatrics

## 2019-02-01 ENCOUNTER — Other Ambulatory Visit: Payer: Self-pay

## 2019-02-01 DIAGNOSIS — G40309 Generalized idiopathic epilepsy and epileptic syndromes, not intractable, without status epilepticus: Secondary | ICD-10-CM | POA: Diagnosis not present

## 2019-02-01 DIAGNOSIS — F840 Autistic disorder: Secondary | ICD-10-CM

## 2019-02-01 DIAGNOSIS — F411 Generalized anxiety disorder: Secondary | ICD-10-CM

## 2019-02-01 MED ORDER — LEVETIRACETAM 100 MG/ML PO SOLN: ORAL | 5 refills | Status: DC

## 2019-02-01 MED ORDER — CLONAZEPAM 0.5 MG PO TBDP: ORAL_TABLET | ORAL | 5 refills | Status: DC

## 2019-02-01 NOTE — Progress Notes (Signed)
This is a Pediatric Specialist E-Visit follow up consult provided via [employer] and their parent/guardian Herndon Grill consented to an E-Visit consult today.  Location of patient: Demar is at home Location of provider: Sherron Flemings is in office Patient was referred by Angeline Slim, MD   The following participants were involved in this E-Visit: mother, patient, [name], provider  Chief Complain/ Reason for E-Visit today: Autism Spectrum Disorder/Epilepsy Total time on call: 13 minutes and 30 seconds Follow up: 6 months    Patient: Timothy Hogan MRN: [id number] Sex: male DOB: Oct 30, 1990  Provider: Wyline Copas, MD Location of Care: [facility]  Note type: Routine return visit  History of Present Illness: Referral Source: TAPM History from: mother, patient and [place] chart Chief Complaint: Seizures  Timothy Hogan is a 28 y.o. male who returns on February 01, 2019, for the first time since May 26, 2018.  The patient has autism spectrum disorder, level 3, generalized convulsive epilepsy that is well controlled.  The patient remains seizure-free.  The Coronavirus pandemic has basically confined him to the house.  He listens to music and watches TV.  His grandmother is considering building a Putt[place]Putt course because they all enjoy playing Putt-Putt.  He also enjoys playing Baggo.  His mother participates in an online auction every week and they go in a trip to purchase whatever she has bid on.  This gives him something to do and he enjoys it.  He takes clonazepam in the morning and at nighttime which helps bring his agitation under control.  When he ran out of it, the agitation worsened greatly.  He spends some time with his mother and some time with his father.  He is able to manipulate his father a little more easily because father spends less time with him.  He is more likely to acquiescent to whatever the patient wants including excessive  time on the computer.  The patient's appetite is good.  He is maintaining his weight.  He is not compulsively picking at his skin as he did previously.  He has an odd mannerism where he will pull out his earbud and cup his hand around his ear.  I am not certain that there is any significance to that.  Review of Systems: A complete review of systems was remarkable for mother reports no changes or concerns at this time., all other systems reviewed and negative.  Past Medical History Diagnosis Date  . Anxiety   . Autism   . Seizures ([place])    Hospitalizations: No., Head Injury: No., Nervous System Infections: No., Immunizations up to date: Yes.    Copied from prior chart Diagnosed with autism at 28 years of age. Language began to regress at 18 months.   Birth History 4 lbs. 2 oz. infant born at [redacted] weeks gestational age to a 27 year old gravida 2 para 63 male  Gestation complicated by maternal one half pack per day smoking, dilated cervix in early labor requiring terbutaline  Labor was induced  Normal spontaneous vaginal delivery  The child was hospitalized for 2-1/2 weeks and had one day of phototherapy.  He appeared to be normal up to a year of age. He walked at 15 months but regressed in his language thereafter.  Behavior History Autism spectrum disorder, level 3  Surgical History Procedure Laterality Date  . CIRCUMCISION  1993   Family History family history includes Autism in his brother; Diabetes in his father; Seizures in his brother.  Family history is negative for migraines, intellectual disabilities, blindness, deafness, birth defects, or chromosomal disorder.  Social History Socioeconomic History  . Marital status: Single  . Years of education:  4715  . Highest education level:  High school certificate  Occupational History  . Not employed  Social Needs  . Financial resource strain: Not on file  . Food insecurity    Worry: Not on file    Inability: Not on  file  . Transportation needs    Medical: Not on file    Non-medical: Not on file  Tobacco Use  . Smoking status: Passive Smoke Exposure - Never Smoker  . Smokeless tobacco: Never Used  . Tobacco comment: Mother smokes   Substance and Sexual Activity  . Alcohol use: No    Alcohol/week: 0.0 standard drinks  . Drug use: No  . 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     He enjoys watching TV, computers, and music.   Allergies Allergen Reactions  . Penicillins Rash   Physical Exam There were no vitals taken for this visit.  I was not able to examine him because this was a telephone call.  Assessment 1. Generalized convulsive epilepsy, G40.309. 2. Autism spectrum disorder with accompanying language impairment and intellectual disability requiring very substantial support (level 3), F84.0.  Discussion [name] is stable neurologically and physically.  There is no reason for us to make any changes in his medication.  Plan I refilled his prescription for levetiracetam and clonazepam.  He will return to see me in 6 months' time, sooner based on clinical need.  I spent 13 minutes 30 seconds on the phone with him.  I urged mother to either come into the office next time or to try a WebEx.   Medication List   Accurate as of February 01, 2019  3:30 PM. If you have any questions, ask your nurse or doctor.    clonazePAM 0.5 MG disintegrating tablet Commonly known as: KLONOPIN TAKE 1 TABLET BY MOUTH IN THE MORNING AND 2 TABLETS IN THE EVENING.   Diastat AcuDial 20 MG Gel Generic drug: diazepam Give 15 mg rectally after 2 minutes of seizures or after 3 recurrent seizures within 10 minutes   levETIRAcetam 100 MG/ML solution Commonly known as: KEPPRA TAKE 1 TEASPOONFUL (5MLS) BY MOUTH 2 TIMES DAILY    The medication list was reviewed and reconciled. All changes or newly prescribed medications were explained.  A  complete medication list was provided to the patient/caregiver.  Deetta PerlaWilliam H  MD

## 2019-02-02 ENCOUNTER — Encounter (INDEPENDENT_AMBULATORY_CARE_PROVIDER_SITE_OTHER): Payer: Self-pay | Admitting: Pediatrics

## 2019-08-12 ENCOUNTER — Telehealth (INDEPENDENT_AMBULATORY_CARE_PROVIDER_SITE_OTHER): Payer: Self-pay | Admitting: Pediatrics

## 2019-08-12 DIAGNOSIS — F840 Autistic disorder: Secondary | ICD-10-CM

## 2019-08-12 DIAGNOSIS — F411 Generalized anxiety disorder: Secondary | ICD-10-CM

## 2019-08-12 MED ORDER — CLONAZEPAM 0.5 MG PO TBDP: ORAL_TABLET | ORAL | 5 refills | Status: DC

## 2019-08-12 NOTE — Telephone Encounter (Signed)
  Who's calling (name and relationship to patient) : Sharyl Nimrod [place] mom   Best contact number: [phone]  Provider they see: Dr. Sharene Skeans  Reason for call: Mom called requesting that the Rx, clonazePAM, be refilled. Please call to inform mom that this order had been placed.     PRESCRIPTION REFILL ONLY  Name of prescription: clonazePAM  [facility] [place]  [address]

## 2019-08-12 NOTE — Telephone Encounter (Signed)
Please send to the pharmacy °

## 2019-08-12 NOTE — Telephone Encounter (Signed)
Prescription was sent.  I called mom to inform her.

## 2019-09-27 ENCOUNTER — Telehealth (INDEPENDENT_AMBULATORY_CARE_PROVIDER_SITE_OTHER): Payer: Medicaid Other | Admitting: Pediatrics

## 2019-09-27 ENCOUNTER — Other Ambulatory Visit: Payer: Self-pay

## 2019-09-27 ENCOUNTER — Encounter (INDEPENDENT_AMBULATORY_CARE_PROVIDER_SITE_OTHER): Payer: Self-pay | Admitting: Pediatrics

## 2019-09-27 DIAGNOSIS — G40309 Generalized idiopathic epilepsy and epileptic syndromes, not intractable, without status epilepticus: Secondary | ICD-10-CM | POA: Diagnosis not present

## 2019-09-27 DIAGNOSIS — F840 Autistic disorder: Secondary | ICD-10-CM

## 2019-09-27 MED ORDER — LEVETIRACETAM 100 MG/ML PO SOLN: ORAL | 5 refills | Status: DC

## 2019-09-27 NOTE — Progress Notes (Signed)
This is a Pediatric Specialist E-Visit follow up consult provided via [place] and their parent/guardian Timothy Hogan consented to an E-Visit consult today.  Location of patient: Timothy Hogan is at home Location of provider: Sherron Hogan is in office Patient was referred by Timothy Slim, MD   The following participants were involved in this E-Visit: mother, patient, [name], provider  Chief Complain/ Reason for E-Visit today: Epilepsy Total time on call: 15 minutes Follow up: 6 months    Patient: Timothy Hogan MRN: [id number] Sex: male DOB: September 04, 1990  Provider: Wyline Copas, MD Location of Care: [facility]  Note type: Routine return visit  History of Present Illness: Referral Source: TAPM History from: mother, patient and [facility] chart Chief Complaint: Seizures  Timothy Hogan is a 29 y.o. male who was seen virtually in the office today for the first time since February 01, 2019.  He has a well-controlled generalized convulsive epilepsy.  He also functions on the autism spectrum, level 3.  His mother has kept him at home.  Is has been difficult to keep him occupied.  He listens to music, watches TV, plays [place], and travels with his mother on some limited trips.  Clonazepam administered twice daily keep his agitation under control.  He spends time both with his mother and his father.  His appetite is good.  His weight is stable.  He goes to bed at midnight and will often sleep until 10 AM to noon.  His seizures remain under complete control on levetiracetam.  That needed to be refilled today.  Review of Systems: A complete review of systems was remarkable for patient is here to be seen for epilepsy. Mom reports that the patient has not had any seizures since his last visit. She states that she has no concerns for this visit., all other systems reviewed and negative.  Past Medical History Diagnosis Date  . Anxiety   . Autism   . Seizures  (Timothy Hogan)    Hospitalizations: No., Head Injury: No., Nervous System Infections: No., Immunizations up to date: Yes.    Copied from prior chart Diagnosed with autism at 29 years of age. Language began to regress at 18 months.   Birth History 4 lbs. 2 oz. infant born at [redacted] weeks gestational age to a 28 year old gravida 2 para 40 male  Gestation complicated by maternal one half pack per day smoking, dilated cervix in early labor requiring terbutaline  Labor was induced  Normal spontaneous vaginal delivery  The child was hospitalized for 2-1/2 weeks and had one day of phototherapy.  He appeared to be normal up to a year of age. He walked at 15 months but regressed in his language thereafter.  Behavior History Autism spectrum disorder,level3  Surgical History Procedure Laterality Date  . CIRCUMCISION  1993   Family History family history includes Autism in his brother; Diabetes in his father; Seizures in his brother. Family history is negative for migraines, intellectual disabilities, blindness, deafness, birth defects, or chromosomal disorder.  Social History Socioeconomic History  . Marital status: Single  . Years of education:  84  . Highest education level:  High school certificate  Occupational History  . Not employed  Tobacco Use  . Smoking status: Passive Smoke Exposure - Never Smoker  . Smokeless tobacco: Never Used  . Tobacco comment: Mother smokes   Substance and Sexual Activity  . Alcohol use: No    Alcohol/week: 0.0 standard drinks  . Drug use: No  .  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     He enjoys watching TV, computers, and music.   Allergies Allergen Reactions  . Penicillins Rash   Physical Exam There were no vitals taken for this visit.  He was minimally cooperative today.  General: alert, well developed, well nourished, in no acute distress, balding, brown  hair, hazel eyes, right handed Head: normocephalic, no dysmorphic features Neck: supple, full range of motion Respiratory: auscultation clear Skin: no rashes or neurocutaneous lesions  Neurologic Exam  Mental Status: alert; active, somewhat agitated his mother that his mother tried to keep him in front of the screen Cranial Nerves: He was able to waved at me.  I could not test visual fields he made poor eye contact, he has a symmetric impassive face Motor: he moves all 4 extremities equally and was able to wiggle his fingers Sensory: intact responses to cold, vibration, proprioception and stereognosis Coordination: I could not assess his coordination but he did not have significant tremor Gait and Station: Slightly broad-based gait and station, I could not test any thing further because he was unable to cooperate  Assessment 1.  Generalized convulsive epilepsy, G40.309. 2.  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  Discussion Timothy Hogan is neurologically stable.  There is no reason to change his clonazepam or levetiracetam.  Plan I refilled his prescription for levetiracetam and will do so for clonazepam when it comes to.  He should return to see me in 6 months.  I hope at that time that he will be vaccinated as well his parents so that his mother will feel comfortable bringing him to the office.  She will contact me if there are any changes in his state such as seizures or further problems with agitation.   Medication List   Accurate as of September 27, 2019  3:29 PM. If you have any questions, ask your nurse or doctor.    clonazePAM 0.5 MG disintegrating tablet Commonly known as: KLONOPIN TAKE 1 TABLET BY MOUTH IN THE MORNING AND 2 TABLETS IN THE EVENING.   Diastat AcuDial 20 MG Gel Generic drug: diazepam Give 15 mg rectally after 2 minutes of seizures or after 3 recurrent seizures within    levETIRAcetam 100 MG/ML solution Commonly known as: KEPPRA TAKE 1 TEASPOONFUL ( ) BY MOUTH 2 TIMES DAILY    The medication list was reviewed and reconciled. All changes or newly prescribed medications were explained.  A complete medication list was provided to the patient/caregiver.  Deetta Perla MD

## 2019-09-27 NOTE — Patient Instructions (Signed)
It was a pleasure to see you today.  I am glad that Timothy Hogan is doing well.  I am even more excited that you are going to get your vaccines in the near future.  I hope that next visit 6 months from now will be in person in our office.  As you know it is kind of difficult to examine him virtually.  I hope that you are feeling better after you go to urgent care.  I refilled the prescription for clonazepam in January and will refill the prescription for levetiracetam now.  We will plan to see him in 6 months.  I will refill the clonazepam before you return.  As long as he is not having seizures I do not think he needs refills of Diastat but I will do that if you want.

## 2020-02-02 ENCOUNTER — Telehealth (INDEPENDENT_AMBULATORY_CARE_PROVIDER_SITE_OTHER): Payer: Self-pay | Admitting: Pediatrics

## 2020-02-02 NOTE — Telephone Encounter (Signed)
Prior Berkley Harvey has been done. We are awaiting a decision

## 2020-02-02 NOTE — Telephone Encounter (Signed)
°  Who's calling (name and relationship to patient) :mom / Doristine Mango   Best contact number:[phone]  Provider they see:Dr. Sharene Skeans   Reason for call:Mom called to follow up about a prior auth for the medication colnazepam and would like a call back about the status of this auth.      PRESCRIPTION REFILL ONLY  Name of prescription:  Pharmacy:

## 2020-02-03 ENCOUNTER — Telehealth (INDEPENDENT_AMBULATORY_CARE_PROVIDER_SITE_OTHER): Payer: Self-pay | Admitting: Pediatrics

## 2020-02-03 DIAGNOSIS — F411 Generalized anxiety disorder: Secondary | ICD-10-CM

## 2020-02-03 DIAGNOSIS — F840 Autistic disorder: Secondary | ICD-10-CM

## 2020-02-03 NOTE — Telephone Encounter (Signed)
Spoke with mom to inform her that we are waiting for the approval from insurance

## 2020-02-03 NOTE — Telephone Encounter (Signed)
Who's calling (name and relationship to patient) : Timothy Hogan mom   Best contact number: [phone]  Provider they see: Dr. Sharene Skeans  Reason for call: Mom wants a call back about prior authorization. She wants to know if the office has received it and if so why it hasn't been completed.   Call ID:      PRESCRIPTION REFILL ONLY  Name of prescription: Clonazepam   [facility] [address]

## 2020-02-09 NOTE — Telephone Encounter (Signed)
Mom Sharyl Nimrod) has called to say that patient is almost out of clonazepam - she wants a call back with the status of the prior authorization. She also states that the pharmacy has informed her that she needs a new prescription for the medication as well.

## 2020-02-09 NOTE — Telephone Encounter (Signed)
I am not sure why but when I did the pa before,it was denied. I redid the prior auth and it has ben approved through [employer]. Please send in a new prescription

## 2020-02-10 MED ORDER — CLONAZEPAM 0.5 MG PO TBDP: ORAL_TABLET | ORAL | 5 refills | Status: DC

## 2020-02-10 NOTE — Addendum Note (Signed)
Addended by: Princella Ion on: 02/10/2020 02:47 PM   Modules accepted: Orders

## 2020-02-10 NOTE — Telephone Encounter (Signed)
Dad came to the office and asked about Marx's medication. Covermymeds website does not link with [place] [employer]. The PA must be done on [employer] in order for Medicaid to approve the medication. I did the PA on [place] [employer] and it was approved. I sent the Rx to the pharmacy. TG

## 2020-02-10 NOTE — Telephone Encounter (Signed)
See other phone note. This has been done. TG

## 2020-02-10 NOTE — Telephone Encounter (Signed)
Mom called to f/u on the request.  Timothy Hogan has been out of medication and she would like to pick up today.  Please call.

## 2020-09-17 ENCOUNTER — Telehealth (INDEPENDENT_AMBULATORY_CARE_PROVIDER_SITE_OTHER): Payer: Self-pay | Admitting: Pediatrics

## 2020-09-17 DIAGNOSIS — F840 Autistic disorder: Secondary | ICD-10-CM

## 2020-09-17 DIAGNOSIS — F411 Generalized anxiety disorder: Secondary | ICD-10-CM

## 2020-09-17 DIAGNOSIS — G40309 Generalized idiopathic epilepsy and epileptic syndromes, not intractable, without status epilepticus: Secondary | ICD-10-CM

## 2020-09-17 MED ORDER — CLONAZEPAM 0.5 MG PO TBDP: ORAL_TABLET | ORAL | 0 refills | Status: DC

## 2020-09-17 MED ORDER — LEVETIRACETAM 100 MG/ML PO SOLN: ORAL | 0 refills | Status: DC

## 2020-09-17 NOTE — Telephone Encounter (Signed)
Refills sent in for Clonazepam and Levetiracetam. TG

## 2020-09-17 NOTE — Telephone Encounter (Signed)
  Who's calling (name and relationship to patient) : [phone]  Best contact number: [phone]  Provider they see: Dr. Sharene Skeans   Reason for call: mom called to schedule a refill appt for the Dr. She requested a refill sent in to last patient until his appt. Mom said he has about two days left     PRESCRIPTION REFILL ONLY  Name of prescription: Clonazepam  [facility] [id number]  [place]

## 2020-10-05 ENCOUNTER — Encounter (INDEPENDENT_AMBULATORY_CARE_PROVIDER_SITE_OTHER): Payer: Self-pay | Admitting: Pediatrics

## 2020-10-05 ENCOUNTER — Encounter (INDEPENDENT_AMBULATORY_CARE_PROVIDER_SITE_OTHER): Payer: Self-pay

## 2020-10-05 ENCOUNTER — Telehealth (INDEPENDENT_AMBULATORY_CARE_PROVIDER_SITE_OTHER): Payer: Medicaid Other | Admitting: Pediatrics

## 2020-10-05 VITALS — Ht 68.0 in | Wt 125.0 lb

## 2020-10-05 DIAGNOSIS — G40309 Generalized idiopathic epilepsy and epileptic syndromes, not intractable, without status epilepticus: Secondary | ICD-10-CM

## 2020-10-05 DIAGNOSIS — F411 Generalized anxiety disorder: Secondary | ICD-10-CM | POA: Diagnosis not present

## 2020-10-05 DIAGNOSIS — F840 Autistic disorder: Secondary | ICD-10-CM

## 2020-10-05 MED ORDER — CLONAZEPAM 0.5 MG PO TBDP: ORAL_TABLET | ORAL | 5 refills | Status: DC

## 2020-10-05 MED ORDER — LEVETIRACETAM 100 MG/ML PO SOLN: ORAL | 5 refills | Status: DC

## 2020-10-05 NOTE — Patient Instructions (Signed)
It was a pleasure to see you in [place] today.  I am glad that his seizures remain in complete control.  I am glad that clonazepam is keeping his behavior in control as well.  He will be seen again in a year.  I will retire April 12, 2021 and promised to you that there will be a provider in our practice to pick up his care.  We have not yet decided who that will be.  In the interim between now and September 30 if you need my help please contact the office and I will do all that I can.  I refilled his prescriptions today for clonazepam and levetiracetam.  After he moved to refill let us know your contact information and your new pharmacy so that we can continue to serve him.

## 2020-10-05 NOTE — Progress Notes (Signed)
This is a Pediatric Specialist E-Visit follow up consult provided via  MyChart Timothy Hogan and their parent/guardian consented to an E-Visit consult today.  Location of patient: Timothy Hogan is at home (location) Location of provider: Jack Hogan is at office (location) Patient was referred by Timothy Mormon, MD   The following participants were involved in this E-Visit: Timothy Hogan, Timothy Carwin, MD (list of participants and their roles)  This visit was done via VIDEO   Chief Complaint/ Reason for E-Visit today: Epilepsy, and autism Total time on call: 20 minutes Follow up: 6 months   Patient: Timothy Hogan MRN: [id number] Sex: male DOB: 11-15-1990  Provider: [name], MD Location of Care: [facility]  Note type: Routine return visit  History of Present Illness: Referral Source: Timothy Nan, MD History from: mother, patient and Timothy Hogan chart Chief Complaint: Seizures  Timothy Hogan is a 30 y.o. male who was evaluated virtually on October 05, 2020 for the first time since a virtual visit September 27, 2019.  He has well-controlled generalized convulsive epilepsy.  He has autism spectrum disorder, level 3.  He has periods of agitation that have been well controlled with low-dose clonazepam twice daily.  Levetiracetam completely controls his seizures.  His general health is good.  He sleeps and eats well.  He goes to bed after midnight and typically sleeps until 8 AM up to 1 PM.  He enjoys long drives in the car.  His mother works in an Human resources officer.  The family currently is living in [place] and they are moving to more spacious and secure surroundings in [place].  Review of Systems: A complete review of systems was assessed and was negative.  Past Medical History Diagnosis Date  . Anxiety   . Autism   . Seizures (HCC)    Hospitalizations: No., Head Injury: No., Nervous System Infections: No., Immunizations up to date: Yes.    Copied from  prior chart Diagnosed with autism at 30 years of age. Language began to regress at 18 months.  Birth History 4 lbs. 2 oz. infant born at [redacted] weeks gestational age to a 29 year old gravida 2 para 81 male  Gestation complicated by maternal one half pack per day smoking, dilated cervix in early labor requiring terbutaline  Labor was induced  Normal spontaneous vaginal delivery  The child was hospitalized for 2-1/2 weeks and had one day of phototherapy.  He appeared to be normal up to a year of age. He walked at 15 months but regressed in his language thereafter.  Behavior History Autism spectrum disorder,level3  Surgical History Procedure Laterality Date  . CIRCUMCISION  1993   Family History family history includes Autism in his brother; Diabetes in his father; Seizures in his brother. Family history is negative for migraines, intellectual disabilities, blindness, deafness, birth defects, or chromosomal disorder.  Social History Socioeconomic History  . Marital status: Single  . Years of education:  13+  . Highest education level:  High school certificate  Occupational History  . Not employed  Tobacco Use  . Smoking status: Passive Smoke Exposure - Never Smoker  . Smokeless tobacco: Never Used  . Tobacco comment: Mother smokes   Substance and Sexual Activity  . Alcohol use: No    Alcohol/week: 0.0 standard drinks  . Drug use: No  . 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  He enjoys watching TV, computers, and music.   Allergies Allergen Reactions  . Penicillins Rash   Physical Exam Ht 5\' 8"  (1.727 m) Comment: reported  Wt 125 lb (56.7 kg) Comment: reported  BMI 19.01 kg/m   General: alert, well developed, well nourished, in no acute distress, balding, light brown hair, hazel eyes, right handed Head: normocephalic, no dysmorphic features Neck: supple, full range of  motion Musculoskeletal: no skeletal deformities or apparent scoliosis Skin: no rashes or neurocutaneous lesions  Neurologic Exam  Mental Status: alert; turns to the video phone when his name is called; knowledge is below normal for age; language is limited Cranial Nerves: visual fields are full to double simultaneous stimuli; extraocular movements are full and conjugate; symmetric, impassive facial strength; midline tongue; hearing appears to be normal bilaterally Motor: normal functional strength, tone and mass; clumsy fine motor movements Coordination: good finger-to-nose, rapid repetitive alternating movements and finger apposition Gait and Station: broad-based somewhat diplegic gait and station; balance is fair  Assessment 1.  Generalized convulsive epilepsy, G40.309. 2.  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  Discussion [place] is medically and neurologically stable.  There is no reason to change his current treatment.  Plan Prescriptions were issued for clonazepam and levetiracetam.  He will return in a year for evaluation.  He will be seen by one of the members of my practice.  I will be happy to see and assist in any way that I can between now and April 12, 2021 when I retire.  Greater than 50% of a 20-minute visit was spent counseling coordination of care concerning his seizures and his autism.  We also discussed transition of care.   Medication List   Accurate as of October 05, 2020  6:11 PM. If you have any questions, ask your nurse or doctor.      TAKE these medications   clonazePAM 0.5 MG disintegrating tablet Commonly known as: KLONOPIN TAKE 1 TABLET BY MOUTH IN THE MORNING AND 2 TABLETS IN THE EVENING.   levETIRAcetam 100 MG/ML solution Commonly known as: KEPPRA TAKE 1 TEASPOONFUL (October 07, 2020) BY MOUTH 2 TIMES DAILY    The medication list was reviewed and reconciled.  All changes or newly prescribed medications were explained.  A complete medication list was provided to the patient/caregiver.  MD

## 2020-11-18 ENCOUNTER — Encounter (INDEPENDENT_AMBULATORY_CARE_PROVIDER_SITE_OTHER): Payer: Self-pay

## 2021-03-07 ENCOUNTER — Telehealth (INDEPENDENT_AMBULATORY_CARE_PROVIDER_SITE_OTHER): Payer: Self-pay | Admitting: Pediatrics

## 2021-03-07 NOTE — Telephone Encounter (Signed)
  Who's calling (name and relationship to patient) : Edmundo, Tedesco (Mother) Best contact number: [phone] (Home) Provider they see: Deetta Perla, MD Reason for call: PA was fax from pharmacy and mom is calling to check the status so that she can get the patients rx.    PRESCRIPTION REFILL ONLY  Name of prescription: ClonazePAM [facility] [phone]

## 2021-03-07 NOTE — Telephone Encounter (Signed)
PA has been done and approved by [place]

## 2021-03-08 NOTE — Telephone Encounter (Signed)
Request date: 03/07/2021 Approved 93 days  Drug name: clonazepam 0.5 mg [place].

## 2021-05-03 ENCOUNTER — Telehealth (INDEPENDENT_AMBULATORY_CARE_PROVIDER_SITE_OTHER): Payer: Self-pay | Admitting: Family

## 2021-05-03 ENCOUNTER — Other Ambulatory Visit (INDEPENDENT_AMBULATORY_CARE_PROVIDER_SITE_OTHER): Payer: Self-pay | Admitting: Pediatrics

## 2021-05-03 DIAGNOSIS — F840 Autistic disorder: Secondary | ICD-10-CM

## 2021-05-03 DIAGNOSIS — F411 Generalized anxiety disorder: Secondary | ICD-10-CM

## 2021-05-03 NOTE — Telephone Encounter (Signed)
  Who's calling (name and relationship to patient) :  Timothy Hogan; mom  Best contact number: [phone]  Provider they see: Dr. Sharene Skeans Dr. Blane Ohara?  Reason for call: Mom stated she has called the pharmacy and they told her the the prescription has lapsed. She would like to know if it will be sent today.  Mom has also stated that the patient was under Dr. Sharene Skeans care, and was suggested Inetta Fermo, but it was not comfirmed.  Mom has requested a call back asap.     PRESCRIPTION REFILL ONLY  Name of prescription:  Pharmacy:

## 2021-10-04 ENCOUNTER — Telehealth (INDEPENDENT_AMBULATORY_CARE_PROVIDER_SITE_OTHER): Payer: Self-pay | Admitting: Family

## 2021-10-04 DIAGNOSIS — G40309 Generalized idiopathic epilepsy and epileptic syndromes, not intractable, without status epilepticus: Secondary | ICD-10-CM

## 2021-10-04 MED ORDER — LEVETIRACETAM 100 MG/ML PO SOLN: ORAL | 2 refills | Status: DC

## 2021-10-04 NOTE — Telephone Encounter (Signed)
?  Name of who is calling:Meredith  ? ?Caller's Relationship to Patient:Mother  ? ?Best contact number:[phone] ? ?Provider they HEN:IDPO Goodpasture  ? ?Reason for call:caller left a VM for Medication Refill, pharmacy told her to call the office  ? ? ? ? ?PRESCRIPTION REFILL ONLY ? ?Name of prescription:KEPPRA  ? ?Pharmacy: ? ? ?

## 2021-10-04 NOTE — Telephone Encounter (Signed)
Refill has been sent. Attempted to speak with mom no answer left vm.  ?

## 2021-11-06 ENCOUNTER — Other Ambulatory Visit (INDEPENDENT_AMBULATORY_CARE_PROVIDER_SITE_OTHER): Payer: Self-pay | Admitting: Family

## 2021-11-06 DIAGNOSIS — F411 Generalized anxiety disorder: Secondary | ICD-10-CM

## 2021-11-06 DIAGNOSIS — F840 Autistic disorder: Secondary | ICD-10-CM

## 2021-12-16 ENCOUNTER — Other Ambulatory Visit (INDEPENDENT_AMBULATORY_CARE_PROVIDER_SITE_OTHER): Payer: Self-pay | Admitting: Family

## 2021-12-16 DIAGNOSIS — F840 Autistic disorder: Secondary | ICD-10-CM

## 2021-12-16 DIAGNOSIS — F411 Generalized anxiety disorder: Secondary | ICD-10-CM

## 2021-12-23 ENCOUNTER — Telehealth (INDEPENDENT_AMBULATORY_CARE_PROVIDER_SITE_OTHER): Payer: Self-pay | Admitting: Family

## 2021-12-23 NOTE — Telephone Encounter (Signed)
  Name of who is calling:Meredith   Caller's Relationship to Patient:Mother   Best contact number:[phone]  Provider they YQM:VHQI [facility]   Reason for call:mom called because she went to pick up the medication and they informed mom that it was not the entire prescription and she would need to contact the office. Mom stated that she wont have enough and does not have the money to pay.      PRESCRIPTION REFILL ONLY  Name of prescription:Clonazepam   [facility] [place] [place]

## 2021-12-24 NOTE — Telephone Encounter (Signed)
I called the pharmacy to get information about this. The pharmacist said that when the next Rx was given at his appointment on June 21st, that there will be another $4 copay at that time. I will not give a larger supply of medication or another refill until [place] is seen on June 21st as he has not been seen since March 2022 when he saw Dr Gaynell Face in a video visit. Please let Mom know. Thanks, Otila Kluver

## 2021-12-25 NOTE — Telephone Encounter (Signed)
Contacted mom and let her know per Timothy Hogan  "The pharmacist said that when the next Rx was given at his appointment on June 21st, that there will be another $4 copay at that time. I will not give a larger supply of medication or another refill until Timothy Hogan is seen on June 21st as he has not been seen since March 2022 when he saw Dr Sharene Skeans in a video visit. "  Informed mom that we would be able to bill the Copay.   Mom reports frustration at the lack of communication on our behalf. She informs due to this lack of communication she is going to have to pay for the prescription when the full 30 supply is sent in. Apologized for the delay in communication on this offices behalf, and reminded her of the appointment on June 21st at 32 (virtual) mom states understanding of the appointment and ended the call.

## 2021-12-31 ENCOUNTER — Telehealth (INDEPENDENT_AMBULATORY_CARE_PROVIDER_SITE_OTHER): Payer: Self-pay | Admitting: Family

## 2021-12-31 DIAGNOSIS — F840 Autistic disorder: Secondary | ICD-10-CM

## 2021-12-31 DIAGNOSIS — F411 Generalized anxiety disorder: Secondary | ICD-10-CM

## 2021-12-31 MED ORDER — CLONAZEPAM 0.5 MG PO TBDP: ORAL_TABLET | ORAL | 0 refills | Status: DC

## 2021-12-31 NOTE — Telephone Encounter (Signed)
  Name of who is calling: Doristine Mango  Caller's Relationship to Patient: Mom  Best contact number: [phone]  Provider they see: Elveria Rising   Reason for call: RX refill     PRESCRIPTION REFILL ONLY  Name of prescription: Clonazapam  [facility]

## 2021-12-31 NOTE — Telephone Encounter (Signed)
I sent in a 20 day supply of medication. Mom rescheduled the appointment for tomorrow June 21st to July 10th. I will not refill medications after July 10th if that appointment is not kept. I mailed a letter to Mom with this information. TG

## 2021-12-31 NOTE — Telephone Encounter (Signed)
  Name of who is calling: Doristine Mango  Caller's Relationship to Patient: Mom  Best contact number: [phone]  Provider they see: Elveria Rising  Reason for call: RX refill     PRESCRIPTION REFILL ONLY  Name of prescription: clonazapam  [facility]

## 2022-01-01 ENCOUNTER — Telehealth (INDEPENDENT_AMBULATORY_CARE_PROVIDER_SITE_OTHER): Payer: Medicaid Other | Admitting: Family

## 2022-01-19 NOTE — Progress Notes (Unsigned)
This is a Pediatric Specialist E-Visit consult/follow up provided via My Chart Timothy Hogan and his mother Timothy Hogan consented to an E-Visit consult today.  Location of patient: Timothy Hogan is at home Location of provider: Elveria Rising, NP-C is at office Patient was referred by Timothy Mormon, MD   The following participants were involved in this E-Visit: [name], NP, patient's mother  This visit was done via VIDEO   Chief Complain/ Reason for E-Visit today: autism and seizure follow up Total time on call: 15 min Follow up: 1 year   Timothy Hogan   MRN:  [id number]  September 27, 1990   Provider: Elveria Rising NP-C Location of Care: [facility]  Visit type: video return visit  Last visit: 10/05/2020 with Timothy Hogan who has now retired  Referral source: Coccaro, Althea Grimmer, MD  History from: Epic chart and patient's mother  Brief history:  Copied from previous record: He has well-controlled generalized convulsive epilepsy.  He has autism spectrum disorder, level 3.  He has periods of agitation that have been well controlled with low-dose clonazepam twice daily.  Levetiracetam completely controls his seizures.  Today's concerns: Mom reports today that Timothy Hogan has remained seizure free since his last visit. He has ongoing problems with restlessness and agitation when he does not get scheduled doses of Clonazepam. Mom reports that he otherwise has a good appetite and generally sleeps well at night.   Timothy Hogan has been otherwise generally healthy since he was last seen. Mom has no other health concerns for him today other than previously mentioned.  Review of systems: Please see HPI for neurologic and other pertinent review of systems. Otherwise all other systems were reviewed and were negative.  Problem List: Patient Active Problem List   Diagnosis Date Noted   Autism spectrum disorder with accompanying language impairment, requiring very substantial support (level  3) 02/14/2014   Generalized convulsive epilepsy (HCC) 02/24/2013     Past Medical History:  Diagnosis Date   Anxiety    Autism    Seizures (HCC)     Past medical history comments: See HPI Copied from previous record: Diagnosed with autism at 31 years of age. Language began to regress at 18 months.    Birth History 4 lbs. 2 oz. infant born at [redacted] weeks gestational age to a 30 year old gravida 2 para 74 male   Gestation complicated by maternal one half pack per day smoking, dilated cervix in early labor requiring terbutaline   Labor was induced   Normal spontaneous vaginal delivery   The child was hospitalized for 2-1/2 weeks and had one day of phototherapy.   He appeared to be normal up to a year of age. He walked at 15 months but regressed in his language thereafter.   Behavior History Autism spectrum disorder, level   Surgical history: Past Surgical History:  Procedure Laterality Date   CIRCUMCISION  46     Family history: family history includes Autism in his brother; Diabetes in his father; Seizures in his brother.   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    He enjoys watching TV, computers, and music.   Social Determinants of Health   Financial Resource Strain: Not on file  Food Insecurity: Not on file  Transportation Needs: Not on file   Physical Activity: Not on file  Stress: Not on file  Social Connections: Not on file  Intimate Partner Violence: Not on file    Past/failed meds:  Allergies: Allergies  Allergen Reactions   Penicillins Rash    Immunizations:  There is no immunization history on file for this patient.    Diagnostics/Screenings:   Physical Exam: Wt 125 lb (56.7 kg)   BMI 19.01 kg/m   Physical exam was limited by video format and by patient's inability to follow instructions. General: well developed, well nourished man, pacing in the background of the video, in no evident distress Head: normocephalic and atraumatic. No dysmorphic features. Neck: supple Musculoskeletal: no skeletal deformities or obvious scoliosis. Skin: no rashes or neurocutaneous lesions  Neurologic Exam Mental Status: awake and fully alert. Has no language.  Unable to follow instructions or participate in the video visit Cranial Nerves: turns to the video when his name is called. Facial movements are symmetric Motor: normal functional bulk, tone and strength in the upper extremities Sensory: withdrawal x 4 Coordination: unable to adequately assess due to patient's inability to participate in examination. No dysmetria when reaching for objects. Gait and Station: broad based with spastic diplegic gait on the video  Impression: Autism spectrum disorder with accompanying language impairment, requiring very substantial support (level 3) - Plan: clonazePAM (KLONOPIN) 0.5 MG disintegrating tablet, DISCONTINUED: clonazePAM (KLONOPIN) 0.5 MG disintegrating tablet  Generalized convulsive epilepsy (HCC) - Plan: levETIRAcetam (KEPPRA) 100 MG/ML solution  Anxiety state - Plan: clonazePAM (KLONOPIN) 0.5 MG disintegrating tablet, DISCONTINUED: clonazePAM (KLONOPIN) 0.5 MG disintegrating tablet    Recommendations for plan of care: The patient's previous Epic records were reviewed. Cledith has neither had nor required imaging or lab  studies since the last visit. He has remained seizure free on Levetiracetam and will continue on this medication for the foreseeable future. His behavior has improved with Clonazepam. I explained to Mom that this is a controlled drug and that Fortune must be seen at least once per year in order for this medication to be prescribed. I will see him back in follow up in 1 year or sooner if needed. Mom agreed with the plans made today.  The medication list was reviewed and reconciled. No changes were made in the prescribed medications today. A complete medication list was provided to the patient.  Return in about 1 year (around 01/21/2023).   Allergies as of 01/20/2022       Reactions   Penicillins Rash        Medication List        Accurate as of January 19, 2022  8:40 AM. If you have any questions, ask your nurse or doctor.          clonazePAM 0.5 MG disintegrating tablet Commonly known as: KLONOPIN TAKE 1 TABLET BY MOUTH IN THE MORNING AND 2 TABLETS IN THE EVENING   levETIRAcetam 100 MG/ML solution Commonly known as: KEPPRA  TAKE 1 TEASPOONFUL ( ) BY MOUTH 2 TIMES DAILY      Total time spent with the patient was 15 minutes, of which 50% or more was spent in counseling and coordination of care.  Timothy Rising NP-C [facility] Ph. [phone] Fax [phone]

## 2022-01-20 ENCOUNTER — Telehealth (INDEPENDENT_AMBULATORY_CARE_PROVIDER_SITE_OTHER): Payer: Medicaid Other | Admitting: Family

## 2022-01-20 ENCOUNTER — Encounter (INDEPENDENT_AMBULATORY_CARE_PROVIDER_SITE_OTHER): Payer: Self-pay | Admitting: Family

## 2022-01-20 VITALS — Wt 125.0 lb

## 2022-01-20 DIAGNOSIS — F411 Generalized anxiety disorder: Secondary | ICD-10-CM | POA: Diagnosis not present

## 2022-01-20 DIAGNOSIS — F840 Autistic disorder: Secondary | ICD-10-CM

## 2022-01-20 DIAGNOSIS — G40309 Generalized idiopathic epilepsy and epileptic syndromes, not intractable, without status epilepticus: Secondary | ICD-10-CM | POA: Diagnosis not present

## 2022-01-20 MED ORDER — LEVETIRACETAM 100 MG/ML PO SOLN: ORAL | 5 refills | Status: DC

## 2022-01-20 MED ORDER — CLONAZEPAM 0.5 MG PO TBDP: ORAL_TABLET | ORAL | 5 refills | Status: DC

## 2022-01-20 NOTE — Patient Instructions (Signed)
It was a pleasure to see you today!  Instructions for you until your next appointment are as follows: I have refilled Gaetan's medications.  Please let me know if you have any questions or concerns. Please sign up for MyChart if you have not done so. Please plan to return for follow up in one year or sooner if needed.  Feel free to contact our office during normal business hours at [phone] with questions or concerns. If there is no answer or the call is outside business hours, please leave a message and our clinic staff will call you back within the next business day.  If you have an urgent concern, please stay on the line for our after-hours answering service and ask for the on-call neurologist.     I also encourage you to use MyChart to communicate with me more directly. If you have not yet signed up for MyChart within [facility], the front desk staff can help you. However, please note that this inbox is NOT monitored on nights or weekends, and response can take up to 2 business days.  Urgent matters should be discussed with the on-call pediatric neurologist.   At [facility], we are committed to providing exceptional care. You will receive a patient satisfaction survey through text or email regarding your visit today. Your opinion is important to me. Comments are appreciated.

## 2022-01-21 MED ORDER — CLONAZEPAM 0.5 MG PO TBDP: ORAL_TABLET | ORAL | 5 refills | Status: DC

## 2022-01-23 ENCOUNTER — Encounter (INDEPENDENT_AMBULATORY_CARE_PROVIDER_SITE_OTHER): Payer: Self-pay | Admitting: Family

## 2022-01-23 DIAGNOSIS — F411 Generalized anxiety disorder: Secondary | ICD-10-CM | POA: Insufficient documentation

## 2022-10-13 ENCOUNTER — Other Ambulatory Visit (INDEPENDENT_AMBULATORY_CARE_PROVIDER_SITE_OTHER): Payer: Self-pay | Admitting: Family

## 2022-10-13 DIAGNOSIS — F411 Generalized anxiety disorder: Secondary | ICD-10-CM

## 2022-10-13 DIAGNOSIS — F840 Autistic disorder: Secondary | ICD-10-CM

## 2022-11-18 ENCOUNTER — Other Ambulatory Visit (INDEPENDENT_AMBULATORY_CARE_PROVIDER_SITE_OTHER): Payer: Self-pay | Admitting: Family

## 2022-11-18 DIAGNOSIS — G40309 Generalized idiopathic epilepsy and epileptic syndromes, not intractable, without status epilepticus: Secondary | ICD-10-CM

## 2023-03-06 ENCOUNTER — Other Ambulatory Visit (INDEPENDENT_AMBULATORY_CARE_PROVIDER_SITE_OTHER): Payer: Self-pay | Admitting: Family

## 2023-03-06 DIAGNOSIS — G40309 Generalized idiopathic epilepsy and epileptic syndromes, not intractable, without status epilepticus: Secondary | ICD-10-CM

## 2023-03-12 ENCOUNTER — Other Ambulatory Visit (INDEPENDENT_AMBULATORY_CARE_PROVIDER_SITE_OTHER): Payer: Self-pay | Admitting: Family

## 2023-03-12 DIAGNOSIS — F411 Generalized anxiety disorder: Secondary | ICD-10-CM

## 2023-03-12 DIAGNOSIS — F840 Autistic disorder: Secondary | ICD-10-CM

## 2023-03-25 ENCOUNTER — Encounter (INDEPENDENT_AMBULATORY_CARE_PROVIDER_SITE_OTHER): Payer: Self-pay | Admitting: Family

## 2023-03-25 ENCOUNTER — Telehealth (INDEPENDENT_AMBULATORY_CARE_PROVIDER_SITE_OTHER): Payer: MEDICAID | Admitting: Family

## 2023-03-25 VITALS — Wt 123.0 lb

## 2023-03-25 DIAGNOSIS — G40309 Generalized idiopathic epilepsy and epileptic syndromes, not intractable, without status epilepticus: Secondary | ICD-10-CM

## 2023-03-25 DIAGNOSIS — F840 Autistic disorder: Secondary | ICD-10-CM

## 2023-03-25 DIAGNOSIS — F411 Generalized anxiety disorder: Secondary | ICD-10-CM | POA: Diagnosis not present

## 2023-03-25 NOTE — Progress Notes (Signed)
This is a Pediatric Specialist E-Visit consult/follow up provided via My Chart Video Visit (Caregility). Timothy Hogan and his mother Timothy Hogan consented to an E-Visit consult today.  Is the patient present for the video visit? Yes Location of patient: Timothy Hogan is at home. Is the patient located in the state of [place]? Yes Location of provider: Elveria Rising, NP-C is at office Patient was referred by Christel Mormon, MD   The following participants were involved in this E-Visit: [name], NP, patient and his mother  This visit was done via VIDEO   Chief Complain/ Reason for E-Visit today: seizure and autism follow up Total time on call: 15 min Follow up: 1 year   Timothy Hogan   MRN:  [id number]  18-Oct-1990   Provider: Elveria Rising NP-C Location of Care: [facility]  Visit type: Return visit  Last visit: 01/20/2022  Referral source: Christel Mormon, MD History from: Epic chart and patient's mother  Brief history:  Copied from previous record: He has well-controlled generalized convulsive epilepsy.  He has autism spectrum disorder, level 3.  He has periods of agitation that have been well controlled with low-dose clonazepam twice daily.  Levetiracetam completely controls his seizures.   Today's concerns: He has remained seizure free since his last visit His mood has been good overall. He has occasional agitation, typically triggered by his father's visits Timothy Hogan has eating a few more foods than in the past.  Timothy Hogan has been otherwise generally healthy since he was last seen. No health concerns today other than previously mentioned.  Review of systems: Please see HPI for neurologic and other pertinent review of systems. Otherwise all other systems were reviewed and were negative.  Problem List: Patient Active Problem List   Diagnosis Date Noted   Anxiety state 01/23/2022   Autism spectrum disorder with  accompanying language impairment, requiring very substantial support (level 3) 02/14/2014   Generalized convulsive epilepsy (HCC) 02/24/2013     Past Medical History:  Diagnosis Date   Anxiety    Autism    Seizures (HCC)     Past medical history comments: See HPI Copied from previous record: Diagnosed with autism at 32 years of age. Language began to regress at 18 months.    Birth History 4 lbs. 2 oz. infant born at [redacted] weeks gestational age to a 31 year old gravida 2 para 35 male   Gestation complicated by maternal one half pack per day smoking, dilated cervix in early labor requiring terbutaline   Labor was induced   Normal spontaneous vaginal delivery   The child was hospitalized for 2-1/2 weeks and had one day of phototherapy.   He appeared to be normal up to a year of age. He walked at 15 months but regressed in his language thereafter.   Behavior History Autism spectrum disorder  Surgical history: Past Surgical History:  Procedure Laterality Date   CIRCUMCISION  81     Family history: family history includes Autism in his brother; Diabetes in his father; Seizures in his brother.   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    He enjoys watching TV, computers, and music.   Social Determinants of Health   Financial Resource Strain: Not on file  Food  Insecurity: Not on file  Transportation Needs: Not on file  Physical Activity: Not on file  Stress: Not on file  Social Connections: Not on file  Intimate Partner Violence: Not on file    Past/failed meds:  Allergies: Allergies  Allergen Reactions   Penicillins Rash    Immunizations:  There is no immunization history on file for this patient.    Diagnostics/Screenings:  Physical Exam: Wt 123 lb (55.8 kg)   BMI 18.70 kg/m   General: well developed, well nourished man, seated at home with his mother, in no evident distress Head: normocephalic and atraumatic. No dysmorphic features. Neck: supple Musculoskeletal: No skeletal deformities or obvious scoliosis Skin: no rashes or neurocutaneous lesions  Neurologic Exam Mental Status: Awake and fully alert.  Has very limited language. He did say hello to me when prompted by his mother. Unable to follow instructions or participate in video visit. Cranial Nerves: Turns to localize faces, objects and sounds in the periphery. Facial movements are symmetric Motor: Normal functional bulk, tone and strength in the upper extremities Sensory: Withdrawal x 4  Coordination: no dysmetria when reaching for objects Gait and Station: broad based diplegic gait on the video  Impression: Autism spectrum disorder with accompanying language impairment, requiring very substantial support (level 3) - Plan: clonazePAM (KLONOPIN) 0.5 MG disintegrating tablet  Generalized convulsive epilepsy (HCC) - Plan: levETIRAcetam (KEPPRA) 100 MG/ML solution  Anxiety state - Plan: clonazePAM (KLONOPIN) 0.5 MG disintegrating tablet   Recommendations for plan of care: The patient's previous Epic records were reviewed. No recent diagnostic studies to be reviewed with the patient.  Plan until next visit: Medication refills sent.  Continue medications as prescribed  Call for questions or concerns Return in about 1 year (around 03/24/2024).  The medication list was  reviewed and reconciled. No changes were made in the prescribed medications today. A complete medication list was provided to the patient.   Allergies as of 03/25/2023       Reactions   Penicillins Rash        Medication List        Accurate as of March 25, 2023 11:59 PM. If you have any questions, ask your nurse or doctor.          clonazePAM 0.5 MG disintegrating tablet Commonly known as: KLONOPIN TAKE 1 TABLET BY MOUTH IN THE MORNING AND 2 TABLETS IN THE EVENING Start taking on: April 09, 2023 What changed: These instructions start on April 09, 2023. If you are unsure what to do until then, ask your doctor or other care provider. Changed by: Elveria Rising   levETIRAcetam 100 MG/ML solution Commonly known as: KEPPRA TAKE 1 TEASPOONFUL ( ) BY MOUTH 2 TIMES DAILY Start taking on: April 02, 2023 What changed: These instructions start on April 02, 2023. If you are unsure what to do until then, ask your doctor or other care provider. Changed by: Elveria Rising  Total time spent with the patient was 15 minutes, of which 50% or more was spent in counseling and coordination of care.  Elveria Rising NP-C [facility] [address], [address] [place], [place] [address] Ph. [phone] Fax [phone]

## 2023-03-25 NOTE — Patient Instructions (Addendum)
It was a pleasure to see you today!  Instructions for you until your next appointment are as follows: Continue Timothy Hogan's medications as prescribed. Please sign up for MyChart if you have not done so. Please plan to return for follow up in 1 year or sooner if needed.  Feel free to contact our office during normal business hours at [phone] with questions or concerns. If there is no answer or the call is outside business hours, please leave a message and our clinic staff will call you back within the next business day.  If you have an urgent concern, please stay on the line for our after-hours answering service and ask for the on-call neurologist.     I also encourage you to use MyChart to communicate with me more directly. If you have not yet signed up for MyChart within [facility], the front desk staff can help you. However, please note that this inbox is NOT monitored on nights or weekends, and response can take up to 2 business days.  Urgent matters should be discussed with the on-call pediatric neurologist.   At [facility], we are committed to providing exceptional care. You will receive a patient satisfaction survey through text or email regarding your visit today. Your opinion is important to me. Comments are appreciated.

## 2023-03-29 ENCOUNTER — Encounter (INDEPENDENT_AMBULATORY_CARE_PROVIDER_SITE_OTHER): Payer: Self-pay | Admitting: Family

## 2023-03-29 MED ORDER — LEVETIRACETAM 100 MG/ML PO SOLN
ORAL | 5 refills | Status: DC
Start: 2023-04-02 — End: 2024-02-08

## 2023-03-29 MED ORDER — CLONAZEPAM 0.5 MG PO TBDP: ORAL_TABLET | ORAL | 5 refills | Status: DC

## 2023-10-15 ENCOUNTER — Other Ambulatory Visit (INDEPENDENT_AMBULATORY_CARE_PROVIDER_SITE_OTHER): Payer: Self-pay | Admitting: Family

## 2023-10-15 DIAGNOSIS — F840 Autistic disorder: Secondary | ICD-10-CM

## 2023-10-15 DIAGNOSIS — F411 Generalized anxiety disorder: Secondary | ICD-10-CM

## 2024-02-06 ENCOUNTER — Other Ambulatory Visit (INDEPENDENT_AMBULATORY_CARE_PROVIDER_SITE_OTHER): Payer: Self-pay | Admitting: Family

## 2024-02-06 DIAGNOSIS — G40309 Generalized idiopathic epilepsy and epileptic syndromes, not intractable, without status epilepticus: Secondary | ICD-10-CM

## 2024-02-22 ENCOUNTER — Telehealth (INDEPENDENT_AMBULATORY_CARE_PROVIDER_SITE_OTHER): Payer: Self-pay | Admitting: Family

## 2024-02-22 NOTE — Telephone Encounter (Signed)
 Mom contacted me to request an updated letter to provide to social services as Timothy Hogan's full time caregiver. I wrote the letter and will mail it to her as requested.

## 2024-03-23 ENCOUNTER — Encounter (INDEPENDENT_AMBULATORY_CARE_PROVIDER_SITE_OTHER): Payer: Self-pay | Admitting: Family

## 2024-03-29 NOTE — Progress Notes (Signed)
 This is a Pediatric Specialist E-Visit consult/follow up provided via My Chart Video Visit (Caregility). Timothy Hogan and his mother Timothy Hogan consented to an E-Visit consult today.  Is the patient present for the video visit? yes Location of patient: Wise is at home  Is the patient located in the state of [place] ? yes Location of provider: Ellouise Bollman, NP-C is at office Patient was referred by Coccaro, Peter J, MD   The following participants were involved in this E-Visit: [name], NP and patient's mother   This visit was done via VIDEO   Chief Complain/ Reason for E-Visit today: seizure and autism follow up Total time on call: 15 min Follow up: 1 year   CAPONE SCHWINN   MRN:  [id number]  [birth year]   Provider: Ellouise Bollman NP-C Location of Care: [facility]  Visit type: Return visit  Last visit: 03/25/2023  Referral source: Doreene Maude PARAS, MD History from: Epic chart and patient's mother  Brief history:  Copied from previous record: He has well-controlled generalized convulsive epilepsy.  He has autism spectrum disorder, level 3.  He has periods of agitation that have been well controlled with low-dose clonazepam  twice daily.  Levetiracetam  completely controls his seizures.   Today's concerns: He has remained seizure free since his last visit Mood is variable but overall is manageable. He has had some increased agitation since his grandmother passed away. Mom notes that the two spent a good deal of time together. Mom reports that [name] has a good appetite and generally sleeps well at night [name] has been otherwise generally healthy since he was last seen. No health concerns today other than previously mentioned.  Review of systems: Please see HPI for neurologic and other pertinent review of systems. Otherwise all other systems were reviewed and were negative.  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 Language began to regress at 18 months.    Birth History 4 lbs. 2 oz. infant born at [redacted] weeks gestational age to a 32 year old gravida 2 para 42 male   Gestation complicated by maternal one half pack per day smoking, dilated cervix in early labor requiring terbutaline   Labor was induced   Normal spontaneous vaginal delivery   The child was hospitalized for 2-1/2 weeks and had one day of phototherapy.   He appeared to be normal up to a year of age. He walked at 15 months but regressed in his language thereafter.   Behavior History Autism spectrum disorder  Surgical history: Past Surgical History:  Procedure Laterality Date   CIRCUMCISION  21     Family history: family history includes Autism in his brother; Diabetes in his father; Seizures in his brother.   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    He enjoys watching TV, computers, and music.    Social Drivers of Corporate investment banker Strain: Not on file  Food Insecurity: Not on file  Transportation Needs: Not on file  Physical Activity: Not on file  Stress: Not on file  Social Connections: Not on file  Intimate Partner Violence: Not on file    Past/failed meds:  Allergies: Allergies  Allergen Reactions   Penicillins Rash    Immunizations: Immunization History  Administered Date(s) Administered   PFIZER(Purple Top)SARS-COV-2 Vaccination 10/18/2019, 11/30/2019, 06/15/2020    Diagnostics/Screenings:  Physical Exam: Wt 127 lb (57.6 kg)   BMI 19.31 kg/m   General: well developed, well nourished young man, seated at home with his mother, in no evident distress. Wears noise reducing headphones Head: normocephalic and atraumatic. No dysmorphic features. Neck: supple Musculoskeletal: No skeletal deformities or obvious scoliosis Skin: no rashes or neurocutaneous lesions  Neurologic Exam Mental Status: Awake and fully alert. Limited attention to the video visit. Said hello when prompted by his mother. Unable to follow instructions or participate in examination Cranial Nerves: Turns to localize faces, objects and sounds in the periphery. Facial movements symmetrical. Motor: Normal functional bulk, tone and strength Sensory: Withdrawal x 4 Coordination: No dysmetria with reach for objects  Impression: Autism spectrum disorder with accompanying language impairment, requiring very substantial support (level 3)  Generalized convulsive epilepsy (HCC) - Plan: levETIRAcetam  (KEPPRA ) 100 MG/ML solution  Anxiety state   Recommendations for plan of care: The patient's previous Epic records were reviewed. No recent diagnostic studies to be reviewed with the patient.  Plan until next visit: Continue medications as prescribed  Call for questions or concerns Return in about 1 year (around 03/30/2025).  The medication list was reviewed and reconciled. No changes were  made in the prescribed medications today. A complete medication list was provided to the patient.  Allergies as of 03/30/2024       Reactions   Penicillins Rash        Medication List        Accurate as of March 30, 2024 11:59 PM. If you have any questions, ask your nurse or doctor.          clonazePAM  0.5 MG disintegrating tablet Commonly known as: KLONOPIN  TAKE 1 TABLET BY MOUTH IN THE MORNING AND 2 TABLETS IN THE EVENING   levETIRAcetam  100 MG/ML solution Commonly known as: KEPPRA  TAKE 1 TEASPOONFUL ( ) BY MOUTH 2 TIMES DAILY   Vitamin D (Ergocalciferol) 1.25 MG (50000 UNIT) Caps capsule Commonly known as: DRISDOL Take 50,000 Units by mouth once a week.      Total time spent with the patient was 15 minutes, of which 50% or more was spent in counseling and coordination of care.  Ellouise Bollman NP-C  [facility] [address] [place], [place] [address] Ph. [phone] Fax [phone]

## 2024-03-30 ENCOUNTER — Telehealth (INDEPENDENT_AMBULATORY_CARE_PROVIDER_SITE_OTHER): Payer: MEDICAID | Admitting: Family

## 2024-03-30 ENCOUNTER — Encounter (INDEPENDENT_AMBULATORY_CARE_PROVIDER_SITE_OTHER): Payer: Self-pay | Admitting: Family

## 2024-03-30 VITALS — Wt 127.0 lb

## 2024-03-30 DIAGNOSIS — G40309 Generalized idiopathic epilepsy and epileptic syndromes, not intractable, without status epilepticus: Secondary | ICD-10-CM

## 2024-03-30 DIAGNOSIS — F411 Generalized anxiety disorder: Secondary | ICD-10-CM | POA: Diagnosis not present

## 2024-03-30 DIAGNOSIS — F840 Autistic disorder: Secondary | ICD-10-CM | POA: Diagnosis not present

## 2024-04-02 ENCOUNTER — Encounter (INDEPENDENT_AMBULATORY_CARE_PROVIDER_SITE_OTHER): Payer: Self-pay | Admitting: Family

## 2024-04-02 MED ORDER — LEVETIRACETAM 100 MG/ML PO SOLN: ORAL | 5 refills | Status: DC

## 2024-04-02 NOTE — Patient Instructions (Signed)
 It was a pleasure to see you today!  Instructions for you until your next appointment are as follows: Continue Weylin's medications as prescribed Call me for questions or concerns Please sign up for MyChart if you have not done so. Please plan to return for follow up in 1 year or sooner if needed.  Feel free to contact our office during normal business hours at [phone] with questions or concerns. If there is no answer or the call is outside business hours, please leave a message and our clinic staff will call you back within the next business day.  If you have an urgent concern, please stay on the line for our after-hours answering service and ask for the on-call neurologist.     I also encourage you to use MyChart to communicate with me more directly. If you have not yet signed up for MyChart within [facility], the front desk staff can help you. However, please note that this inbox is NOT monitored on nights or weekends, and response can take up to 2 business days.  Urgent matters should be discussed with the on-call pediatric neurologist.   At [facility], we are committed to providing exceptional care. You will receive a patient satisfaction survey through text or email regarding your visit today. Your opinion is important to me. Comments are appreciated.

## 2024-05-04 ENCOUNTER — Other Ambulatory Visit (INDEPENDENT_AMBULATORY_CARE_PROVIDER_SITE_OTHER): Payer: Self-pay | Admitting: Family

## 2024-05-04 DIAGNOSIS — F840 Autistic disorder: Secondary | ICD-10-CM

## 2024-05-04 DIAGNOSIS — F411 Generalized anxiety disorder: Secondary | ICD-10-CM

## 2024-06-13 ENCOUNTER — Telehealth (INDEPENDENT_AMBULATORY_CARE_PROVIDER_SITE_OTHER): Payer: Self-pay | Admitting: Pharmacy Technician

## 2024-06-13 ENCOUNTER — Encounter (INDEPENDENT_AMBULATORY_CARE_PROVIDER_SITE_OTHER): Payer: Self-pay | Admitting: Pharmacy Technician

## 2024-06-13 ENCOUNTER — Other Ambulatory Visit (HOSPITAL_COMMUNITY): Payer: Self-pay

## 2024-06-13 NOTE — Telephone Encounter (Signed)
 Pharmacy Patient Advocate Encounter  Received notification from [facility] that Prior Authorization for clonazePAM  0.5MG  dispersible tablets  has been APPROVED from 06/13/24 to 06/13/25. Ran test claim, Copay is $4.00. This test claim was processed through [facility]- copay amounts may vary at other pharmacies due to pharmacy/plan contracts, or as the patient moves through the different stages of their insurance plan.   PA #/Case ID/Reference #: [id number]

## 2024-06-13 NOTE — Telephone Encounter (Signed)
 Pharmacy Patient Advocate Encounter   Received notification from [employer]eds that prior authorization for clonazePAM  0.5MG  dispersible tablets  is required/requested.   Insurance verification completed.   The patient is insured through [employer].   Per test claim: PA required; PA submitted to above mentioned insurance via Latent Key/confirmation #/EOC [id number] Status is pending

## 2024-06-13 NOTE — Telephone Encounter (Addendum)
 error

## 2024-07-03 ENCOUNTER — Other Ambulatory Visit (INDEPENDENT_AMBULATORY_CARE_PROVIDER_SITE_OTHER): Payer: Self-pay | Admitting: Family

## 2024-07-03 DIAGNOSIS — G40309 Generalized idiopathic epilepsy and epileptic syndromes, not intractable, without status epilepticus: Secondary | ICD-10-CM

## 2024-07-04 ENCOUNTER — Other Ambulatory Visit (INDEPENDENT_AMBULATORY_CARE_PROVIDER_SITE_OTHER): Payer: Self-pay | Admitting: Family

## 2024-07-04 DIAGNOSIS — G40309 Generalized idiopathic epilepsy and epileptic syndromes, not intractable, without status epilepticus: Secondary | ICD-10-CM

## 2024-07-04 MED ORDER — LEVETIRACETAM 100 MG/ML PO SOLN: ORAL | 5 refills | Status: AC
